# Patient Record
Sex: Female | Born: 2000
Health system: Southern US, Community
[De-identification: ages and names within clinical notes are randomized; demographics above are authoritative.]

## PROBLEM LIST (undated history)

## (undated) DIAGNOSIS — F603 Borderline personality disorder: Secondary | ICD-10-CM

## (undated) DIAGNOSIS — I10 Essential (primary) hypertension: Secondary | ICD-10-CM

## (undated) DIAGNOSIS — F913 Oppositional defiant disorder: Secondary | ICD-10-CM

## (undated) DIAGNOSIS — F909 Attention-deficit hyperactivity disorder, unspecified type: Secondary | ICD-10-CM

## (undated) DIAGNOSIS — K5909 Other constipation: Secondary | ICD-10-CM

## (undated) DIAGNOSIS — L309 Dermatitis, unspecified: Secondary | ICD-10-CM

## (undated) DIAGNOSIS — I499 Cardiac arrhythmia, unspecified: Secondary | ICD-10-CM

## (undated) DIAGNOSIS — E282 Polycystic ovarian syndrome: Secondary | ICD-10-CM

## (undated) DIAGNOSIS — F32A Depression, unspecified: Secondary | ICD-10-CM

## (undated) DIAGNOSIS — R011 Cardiac murmur, unspecified: Secondary | ICD-10-CM

## (undated) DIAGNOSIS — F419 Anxiety disorder, unspecified: Secondary | ICD-10-CM

## (undated) DIAGNOSIS — R519 Headache, unspecified: Secondary | ICD-10-CM

## (undated) HISTORY — PX: ADENOIDECTOMY: SUR15

## (undated) HISTORY — DX: Attention-deficit hyperactivity disorder, unspecified type: F90.9

## (undated) HISTORY — DX: Other constipation: K59.09

## (undated) HISTORY — DX: Anxiety disorder, unspecified: F41.9

## (undated) HISTORY — DX: Cardiac murmur, unspecified: R01.1

## (undated) HISTORY — DX: Cardiac arrhythmia, unspecified: I49.9

## (undated) HISTORY — DX: Depression, unspecified: F32.A

## (undated) HISTORY — DX: Dermatitis, unspecified: L30.9

## (undated) HISTORY — DX: Headache, unspecified: R51.9

## (undated) HISTORY — DX: Polycystic ovarian syndrome: E28.2

## (undated) HISTORY — PX: TONSILLECTOMY: SUR1361

## (undated) HISTORY — DX: Borderline personality disorder: F60.3

---
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
## 2021-02-16 ENCOUNTER — Ambulatory Visit (HOSPITAL_COMMUNITY): Payer: Medicaid Other | Admitting: Clinical

## 2021-03-27 ENCOUNTER — Other Ambulatory Visit: Payer: Self-pay

## 2021-03-27 ENCOUNTER — Telehealth (INDEPENDENT_AMBULATORY_CARE_PROVIDER_SITE_OTHER): Payer: Medicaid Other | Admitting: Psychiatry

## 2021-03-27 ENCOUNTER — Encounter (HOSPITAL_COMMUNITY): Payer: Self-pay | Admitting: Psychiatry

## 2021-03-27 DIAGNOSIS — F3342 Major depressive disorder, recurrent, in full remission: Secondary | ICD-10-CM | POA: Diagnosis not present

## 2021-03-27 MED ORDER — SERTRALINE HCL 100 MG PO TABS: ORAL_TABLET | ORAL | 3 refills | Status: DC

## 2021-03-27 MED ORDER — BUSPIRONE HCL 15 MG PO TABS: 15.0000 mg | ORAL_TABLET | Freq: Three times a day (TID) | ORAL | 3 refills | Status: DC

## 2021-03-27 MED ORDER — TRAZODONE HCL 150 MG PO TABS: 150.0000 mg | ORAL_TABLET | Freq: Every evening | ORAL | 3 refills | Status: DC | PRN

## 2021-03-27 MED ORDER — ARIPIPRAZOLE 10 MG PO TABS: 10.0000 mg | ORAL_TABLET | Freq: Every day | ORAL | 3 refills | Status: DC

## 2021-03-27 NOTE — Progress Notes (Signed)
[facility] MD/PA/NP OPVirtual Visit via Video Note  I connected with Danielle Hobbs on 03/27/21 at  4:00 PM EST by a video enabled telemedicine application and verified that I am speaking with the correct person using two identifiers.  Location: Patient: Home Provider: Clinic   I discussed the limitations of evaluation and management by telemedicine and the availability of in person appointments. The patient expressed understanding and agreed to proceed.  I provided 30 minutes of non-face-to-face time during this encounter.   03/27/2021 4:28 PM Danielle Hobbs  MRN:  [id number]  Chief Complaint: "I have been feeling okay and not okay  HPI: 20 year old female seen today for follow-up psychiatric evaluation.    She has a psychiatric history of ADHD, adjustment disorder, and depression.  She is currently managed on Adderall XR2 0 mg twice daily, Abilify 5 mg daily, BuSpar 10 mg twice daily, Zoloft 200 mg daily, and trazodone 150 mg nightly.  She notes her medications are somewhat effective in managing her psychiatric conditions.  Today she is pleasant, cooperative, engaged in conversation, and maintains eye contact.  She informed Clinical research associate that since her last visit she has been feeling okay some days and not okay on other days. She notes that recently she has cut some negative people out her life and is adjusting to this new change. Patient also informed writer that she has been more anxious and depressed. Provider conducted a GAD 7 and patient scored a 19, at her last visit she scored a 18. Provider also conducted a PHQ 9 and patient scored a 20. Today she endorses passive SI but denies wanting to harm herself noting that she has had passive SI for years. Today she denies SI/HI/VAH or mania. At times she notes that she feels paranoid. She notes at times her sleep is poor noting she sleeps 4-5 hour. She notes that on occassions she take Trazodone 300 mg. Provider informed patient to be cautious about adjusting  Trazodone while on Zoloft.  Patient reports having an adequate appetite but notes that she has lost 20 pounds since her last visit. She notes that she has been more active (walking for an hour). Since walking she notes that she has more energy but reports still feeling tired.   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  Today she is agreeable to increase Buspar 10 mg three times daily to 15 mg three times daily help manage anxiety and depression. She was also agreeable to increase Abilify 5 mg to 19 mg to help manage depression. Patient missed therapy appointment but was rescheduled today to follow up with outpatient therapy. No other concerns noted at this time.   Visit Diagnosis:    ICD-10-CM   1. MDD (major depressive disorder), recurrent, in full remission (HCC)  F33.42 ARIPiprazole (ABILIFY) 10 MG tablet    busPIRone (BUSPAR) 15 MG tablet    sertraline (ZOLOFT) 100 MG tablet    traZODone (DESYREL) 150 MG tablet      Past Psychiatric History: Adjustment disorder, ADHD, and Depression   Past Medical History:  Past Medical History:  Diagnosis Date   ADHD (attention deficit hyperactivity disorder)    Anxiety    Chronic constipation 01/15/2013   Depression    Phreesia 08/06/2020   Eczema 02/23/2014   Heart murmur    Phreesia 08/06/2020   Irregular heart rhythm 02/23/2014   Benign PVC cardiology evaluated  and no change to ADHD meds needed     Oppositional defiant disorder    PCOS (polycystic ovarian syndrome)     Past Surgical History:  Procedure Laterality Date   ADENOIDECTOMY     TONSILLECTOMY      Family Psychiatric History: Maternal Grandmother Bipolar disorder, anxiety, and depression   Family History:  Family History  Problem Relation Age of Onset   Diabetes Maternal Grandmother    Drug abuse Maternal Grandmother    Bipolar disorder Maternal  Grandmother    Depression Maternal Grandmother    Anxiety disorder Mother    Depression Mother    Hypertension Other     Social History:  Social History   Socioeconomic History   Marital status: Single    Spouse name: Not on file   Number of children: 0   Years of education: Not on file   Highest education level: 12th grade  Occupational History   Not on file  Tobacco Use   Smoking status: Passive Smoke Exposure - Never Smoker   Smokeless tobacco: Never   Tobacco comments:    Moms smoke in house  Vaping Use   Vaping Use: Never used  Substance and Sexual Activity   Alcohol use: Not Currently   Drug use: Yes    Types: Marijuana   Sexual activity: Yes    Birth control/protection: Pill  Other Topics Concern   Not on file  Social History Narrative   Not on file   Social Determinants of Health   Financial Resource Strain: Not on file  Food Insecurity: No Food Insecurity   Worried About Running Out of Food in the Last Year: Never true   Ran Out of Food in the Last Year: Never true  Transportation Needs: Not on file  Physical Activity: Not on file  Stress: Not on file  Social Connections: Not on file    Allergies: No Known Allergies  Metabolic Disorder Labs: Lab Results  Component Value Date   HGBA1C 5.8 (H) 02/18/2019   MPG 119.76 02/18/2019   MPG 117 07/04/2017   Lab Results  Component Value Date   PROLACTIN 14.3 01/05/2016   Lab Results  Component Value Date   CHOL 133 02/18/2019   TRIG 63 02/18/2019   HDL 38 (L) 02/18/2019   CHOLHDL 3.5 02/18/2019   VLDL 13 02/18/2019   LDLCALC 82 02/18/2019   LDLCALC 76 01/05/2016   Lab Results  Component Value Date   TSH 1.702 02/18/2019   TSH 1.53 01/05/2016    Therapeutic Level Labs: No results found for: LITHIUM No results found for: VALPROATE No components found for:  CBMZ  Current Medications: Current Outpatient Medications  Medication Sig Dispense Refill   amphetamine-dextroamphetamine (ADDERALL  XR) 20 MG 24 hr capsule Take 2 capsules in the morning 60 capsule 0   ARIPiprazole (ABILIFY) 10 MG tablet Take 1 tablet (10 mg total) by mouth daily. 30 tablet 3   busPIRone (BUSPAR) 15 MG tablet Take 1 tablet (15 mg total) by mouth 3 (three) times daily. 90 tablet 3   cetirizine (ZYRTEC) 10 MG tablet Take 1 tablet (10 mg total) by mouth daily. 30 tablet 3   norgestimate-ethinyl estradiol (SPRINTEC 28) 0.25-35 MG-MCG tablet Take 1 tablet by mouth daily. 30 tablet 4   sertraline (ZOLOFT) 100 MG tablet Take two tablets daily 60 tablet 3   traZODone (DESYREL) 150 MG tablet Take 1 tablet (150 mg total) by mouth at bedtime as needed for sleep. 30 tablet 3  No current facility-administered medications for this visit.     Musculoskeletal: Strength & Muscle Tone:  Unable to assess due to telehealth visit Gait & Station:  Unable to assess due to telehealth visit Patient leans: N/A  Psychiatric Specialty Exam: Review of Systems  There were no vitals taken for this visit.There is no height or weight on file to calculate BMI.  General Appearance: Well Groomed  Eye Contact:  Good  Speech:  Clear and Coherent and Normal Rate  Volume:  Normal  Mood:  Anxious and Depressed  Affect:  Appropriate and Congruent  Thought Process:  Coherent, Goal Directed, and Linear  Orientation:  Full (Time, Place, and Person)  Thought Content: WDL and Logical   Suicidal Thoughts:  No  Homicidal Thoughts:  No  Memory:  Immediate;   Good Recent;   Good Remote;   Good  Judgement:  Good  Insight:  Good  Psychomotor Activity:  Normal  Concentration:  Concentration: Good and Attention Span: Good  Recall:  Good  Fund of Knowledge: Good  Language: Good  Akathisia:  No  Handed:  Right  AIMS (if indicated): not done  Assets:  Communication Skills Desire for Improvement Financial Resources/Insurance Housing Physical Health Social Support Transportation  ADL's:  Intact  Cognition: WNL  Sleep:  Fair    Screenings: AIMS    Flowsheet Row [facility]  AIMS Total Score 0      GAD-7    Flowsheet Row Video Visit from 03/27/2021 in [facility] Video Visit from 12/25/2020 in [facility] Integrated Behavioral Health from 06/13/2017 in [place] and [facility] Center for Child and Adolescent Health  Total GAD-7 Score 19 18 13       PHQ2-9    Flowsheet Row Video Visit from 03/27/2021 in [facility] Video Visit from 12/25/2020 in [facility] Nutrition from 09/14/2020 in [facility] from 07/04/2017 in [place] and [facility] Center for Child and Adolescent Health Integrated Behavioral Health from 06/13/2017 in [place] and [facility] Center for Child and Adolescent Health  PHQ-2 Total Score 4 4 6 3 4   PHQ-9 Total Score 20 19 15 11 13       Flowsheet Row Video Visit from 03/27/2021 in [facility] Video Visit from 12/25/2020 in [facility] ED from 08/22/2020 in [facility] EMERGENCY DEPARTMENT  C-SSRS RISK CATEGORY Error: Q7 should not be populated when Q6 is No Error: Q7 should not be populated when Q6 is No No Risk        Assessment and Plan: Patient notes that she is anxious and depressed most day and has disturbed sleep. Today she is agreeable to increase Buspar 10 mg three times daily to 15 mg three times daily help manage anxiety and depression. She was also agreeable to increase Abilify 5 mg to 19 mg to help manage depression. Patient missed therapy appointment but was rescheduled today to follow up with outpatient therapy  1. MDD (major depressive disorder), recurrent, in full remission (HCC)  Increased- ARIPiprazole (ABILIFY) 10 MG tablet; Take 1 tablet (10 mg total) by mouth  daily.  Dispense: 30 tablet; Refill: 3 Increased- busPIRone (BUSPAR) 15 MG tablet; Take 1 tablet (15 mg total) by mouth 3 (three) times daily.  Dispense: 90 tablet; Refill: 3 Continue- sertraline (ZOLOFT) 100 MG tablet; Take two tablets  daily  Dispense: 60 tablet; Refill: 3 Continue- traZODone (DESYREL) 150 MG tablet; Take 1 tablet (150 mg total) by mouth at bedtime as needed for sleep.  Dispense: 30 tablet; Refill: 3    Follow-up in 3 months Follow-up with therapy  Shanna Cisco, NP 03/27/2021, 4:28 PM

## 2021-04-19 ENCOUNTER — Other Ambulatory Visit (HOSPITAL_COMMUNITY): Payer: Self-pay | Admitting: Psychiatry

## 2021-04-19 ENCOUNTER — Telehealth (HOSPITAL_COMMUNITY): Payer: Self-pay | Admitting: *Deleted

## 2021-04-19 DIAGNOSIS — F900 Attention-deficit hyperactivity disorder, predominantly inattentive type: Secondary | ICD-10-CM

## 2021-04-19 MED ORDER — AMPHETAMINE-DEXTROAMPHET ER 20 MG PO CP24: ORAL_CAPSULE | ORAL | 0 refills | Status: DC

## 2021-04-19 NOTE — Telephone Encounter (Signed)
Call from [facility] requesting a new rx for her Adderall. She was not aware it had to be called in monthly and per chart it hasnt been called in since Aug. Dr Doyne Keel last note does not include it in her summary paragraph but it is listed in meds she is taking. She has a next appt with her provider on 06/19/21. Will forward request to Dr Doyne Keel to see if she would like to continue with it and send it to her preferred pharmacy.

## 2021-04-19 NOTE — Telephone Encounter (Signed)
Medication refilled and sent to preferred pharmacy

## 2021-05-24 ENCOUNTER — Other Ambulatory Visit (HOSPITAL_COMMUNITY): Payer: Self-pay | Admitting: Psychiatry

## 2021-05-24 ENCOUNTER — Telehealth (HOSPITAL_COMMUNITY): Payer: Self-pay | Admitting: *Deleted

## 2021-05-24 DIAGNOSIS — F900 Attention-deficit hyperactivity disorder, predominantly inattentive type: Secondary | ICD-10-CM

## 2021-05-24 MED ORDER — AMPHETAMINE-DEXTROAMPHET ER 20 MG PO CP24: ORAL_CAPSULE | ORAL | 0 refills | Status: DC

## 2021-05-24 NOTE — Telephone Encounter (Signed)
Provider refilled adderall as patient has refills on other medications. Adderall sent to preferred pharmacy.

## 2021-05-24 NOTE — Telephone Encounter (Signed)
Request from patient for a new rx for her adderall. Reviewed record and she should be out. She has a future appt with Dr Doyne Keel on 06/19/21. She was seen as recently as Nov. Will forward request to Dr Doyne Keel for it to be called into her preferred pharmacy.

## 2021-06-06 ENCOUNTER — Ambulatory Visit (INDEPENDENT_AMBULATORY_CARE_PROVIDER_SITE_OTHER): Payer: Medicaid Other | Admitting: Licensed Clinical Social Worker

## 2021-06-06 ENCOUNTER — Other Ambulatory Visit: Payer: Self-pay

## 2021-06-06 DIAGNOSIS — F331 Major depressive disorder, recurrent, moderate: Secondary | ICD-10-CM
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
## 2021-06-19 ENCOUNTER — Telehealth (INDEPENDENT_AMBULATORY_CARE_PROVIDER_SITE_OTHER): Payer: Medicaid Other | Admitting: Psychiatry

## 2021-06-19 ENCOUNTER — Other Ambulatory Visit: Payer: Self-pay

## 2021-06-19 ENCOUNTER — Encounter (HOSPITAL_COMMUNITY): Payer: Self-pay | Admitting: Psychiatry

## 2021-06-19 DIAGNOSIS — F900 Attention-deficit hyperactivity disorder, predominantly inattentive type: Secondary | ICD-10-CM

## 2021-06-19 DIAGNOSIS — F3342 Major depressive disorder, recurrent, in full remission: Secondary | ICD-10-CM | POA: Diagnosis not present

## 2021-06-19 MED ORDER — AMPHETAMINE-DEXTROAMPHET ER 20 MG PO CP24: ORAL_CAPSULE | ORAL | 0 refills | Status: DC

## 2021-06-19 MED ORDER — BUSPIRONE HCL 15 MG PO TABS: 15.0000 mg | ORAL_TABLET | Freq: Three times a day (TID) | ORAL | 3 refills | Status: DC

## 2021-06-19 MED ORDER — TRAZODONE HCL 150 MG PO TABS: 150.0000 mg | ORAL_TABLET | Freq: Every evening | ORAL | 3 refills | Status: DC | PRN

## 2021-06-19 MED ORDER — ARIPIPRAZOLE 10 MG PO TABS: 10.0000 mg | ORAL_TABLET | Freq: Every day | ORAL | 3 refills | Status: AC

## 2021-06-19 MED ORDER — SERTRALINE HCL 100 MG PO TABS: ORAL_TABLET | ORAL | 3 refills | Status: DC

## 2021-06-19 NOTE — Progress Notes (Signed)
[facility] O[facility] Video Note  Virtual Visit via Telephone Note  I connected with Danielle Hobbs on 06/19/21 at  3:30 PM EST by telephone and verified that I am speaking with the correct person using two identifiers.  Location: Patient: home Provider: Clinic   I discussed the limitations, risks, security and privacy concerns of performing an evaluation and management service by telephone and the availability of in person appointments. I also discussed with the patient that there may be a patient responsible charge related to this service. The patient expressed understanding and agreed to proceed.   I provided 30 minutes of non-face-to-face time during this encounter.    06/19/2021 12:19 PM Danielle BoatmanJayna Hobbs  MRN:  [id number]  Chief Complaint: "  I feel fine"  Per mother "I am very proud of her accomplishments"  HPI: 21 year old female seen today for follow-up psychiatric evaluation.    She has a psychiatric history of ADHD, adjustment disorder, and depression.  She is currently managed on Adderall XR 20 mg twice daily, Abilify 10 mg daily, BuSpar 15 mg twice daily, Zoloft 200 mg daily, and trazodone 150 mg nightly.  She notes her medications are  effective in managing her psychiatric conditions.  Today she is unable to login virtually so assessment was done over the phone.  During exam she was pleasant, cooperative, and engaged in conversation.  She informed Clinical research associatewriter that since her last visit she has been feeling fine.  She reports that the increase in BuSpar and Abilify has positively impacted her anxiety and depression.  Patient was seen with her mother who notes that she is proud of her accomplishment and notes that she has been happier recently.  Patient informed Clinical research associatewriter that she has had positive events to occur in her life.  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  She continues to study [employer] but notes that she has not  returned to school yet this semester.  She plans to eventually transfer to [employer]  Since her last visit she reports that her anxiety, depression, and sleep has improved.  Provider conducted a GAD-7 and patient scored a 14, at her last visit she scored a 19.  Provider also conducted PHQ-9 and patient scored an 11, at her last visit she scored a 20.  She endorses sleeping approximately 5 to 6 hours nightly.  She reports that her appetite is adequate.  Today she endorses passive SI but denies wanting to harm herself.  She denies SI/HI/VH, mania, or paranoia.   No medication changes made today.  Patient agreeable to continue medication as prescribed.  She will follow-up with outpatient counseling for therapy.  No other complaints.  Visit Diagnosis:    ICD-10-CM   1. Attention deficit hyperactivity disorder (ADHD), predominantly inattentive type  F90.0 amphetamine-dextroamphetamine (ADDERALL XR) 20 MG 24 hr capsule    2. MDD (major depressive disorder), recurrent, in full remission (HCC)  F33.42 ARIPiprazole (ABILIFY) 10 MG tablet    busPIRone (BUSPAR) 15 MG tablet    sertraline (ZOLOFT) 100 MG tablet    traZODone (DESYREL) 150 MG tablet      Past Psychiatric History: Adjustment disorder, ADHD, and Depression   Past Medical History:  Past Medical History:  Diagnosis Date   ADHD (attention deficit hyperactivity disorder)    Anxiety    Chronic constipation 01/15/2013   Depression    Phreesia 08/06/2020   Eczema 02/23/2014   Heart  murmur    Phreesia 08/06/2020   Irregular heart rhythm 02/23/2014   Benign PVC cardiology evaluated and no change to ADHD meds needed     Oppositional defiant disorder    PCOS (polycystic ovarian syndrome)     Past Surgical History:  Procedure Laterality Date   ADENOIDECTOMY     TONSILLECTOMY      Family Psychiatric History: Maternal Grandmother Bipolar disorder, anxiety, and depression   Family History:  Family History  Problem Relation Age  of Onset   Diabetes Maternal Grandmother    Drug abuse Maternal Grandmother    Bipolar disorder Maternal Grandmother    Depression Maternal Grandmother    Anxiety disorder Mother    Depression Mother    Hypertension Other     Social History:  Social History   Socioeconomic History   Marital status: Single    Spouse name: Not on file   Number of children: 0   Years of education: Not on file   Highest education level: 12th grade  Occupational History   Not on file  Tobacco Use   Smoking status: Passive Smoke Exposure - Never Smoker   Smokeless tobacco: Never   Tobacco comments:    Moms smoke in house  Vaping Use   Vaping Use: Never used  Substance and Sexual Activity   Alcohol use: Not Currently   Drug use: Yes    Types: Marijuana   Sexual activity: Yes    Birth control/protection: Pill  Other Topics Concern   Not on file  Social History Narrative   Not on file   Social Determinants of Health   Financial Resource Strain: Not on file  Food Insecurity: No Food Insecurity   Worried About Running Out of Food in the Last Year: Never true   Ran Out of Food in the Last Year: Never true  Transportation Needs: Not on file  Physical Activity: Not on file  Stress: Not on file  Social Connections: Not on file    Allergies: No Known Allergies  Metabolic Disorder Labs: Lab Results  Component Value Date   HGBA1C 5.8 (H) 02/18/2019   MPG 119.76 02/18/2019   MPG 117 07/04/2017   Lab Results  Component Value Date   PROLACTIN 14.3 01/05/2016   Lab Results  Component Value Date   CHOL 133 02/18/2019   TRIG 63 02/18/2019   HDL 38 (L) 02/18/2019   CHOLHDL 3.5 02/18/2019   VLDL 13 02/18/2019   LDLCALC 82 02/18/2019   LDLCALC 76 01/05/2016   Lab Results  Component Value Date   TSH 1.702 02/18/2019   TSH 1.53 01/05/2016    Therapeutic Level Labs: No results found for: LITHIUM No results found for: VALPROATE No components found for:  CBMZ  Current  Medications: Current Outpatient Medications  Medication Sig Dispense Refill   amphetamine-dextroamphetamine (ADDERALL XR) 20 MG 24 hr capsule Take 2 capsules in the morning 60 capsule 0   ARIPiprazole (ABILIFY) 10 MG tablet Take 1 tablet (10 mg total) by mouth daily. 30 tablet 3   busPIRone (BUSPAR) 15 MG tablet Take 1 tablet (15 mg total) by mouth 3 (three) times daily. 90 tablet 3   cetirizine (ZYRTEC) 10 MG tablet Take 1 tablet (10 mg total) by mouth daily. 30 tablet 3   norgestimate-ethinyl estradiol (SPRINTEC 28) 0.25-35 MG-MCG tablet Take 1 tablet by mouth daily. 30 tablet 4   sertraline (ZOLOFT) 100 MG tablet Take two tablets daily 60 tablet 3   traZODone (DESYREL) 150 MG tablet  Take 1 tablet (150 mg total) by mouth at bedtime as needed for sleep. 30 tablet 3   No current facility-administered medications for this visit.     Musculoskeletal: Strength & Muscle Tone:  Unable to assess due to telephone visit Gait & Station:  Unable to assess due to telephone visit Patient leans: N/A  Psychiatric Specialty Exam: Review of Systems  There were no vitals taken for this visit.There is no height or weight on file to calculate BMI.  General Appearance:  Unable to assess due to telephone visit  Eye Contact:   Unable to assess due to telephone visit  Speech:  Clear and Coherent and Normal Rate  Volume:  Normal  Mood:  Euthymic  Affect:  Appropriate and Congruent  Thought Process:  Coherent, Goal Directed, and Linear  Orientation:  Full (Time, Place, and Person)  Thought Content: WDL and Logical   Suicidal Thoughts:  Yes.  with intent/plan  Homicidal Thoughts:  No  Memory:  Immediate;   Good Recent;   Good Remote;   Good  Judgement:  Good  Insight:  Good  Psychomotor Activity:  Normal  Concentration:  Concentration: Good and Attention Span: Good  Recall:  Good  Fund of Knowledge: Good  Language: Good  Akathisia:  No  Handed:  Right  AIMS (if indicated): not done  Assets:   Communication Skills Desire for Improvement Financial Resources/Insurance Housing Physical Health Social Support Transportation  ADL's:  Intact  Cognition: WNL  Sleep:  Fair   Screenings: AIMS    Flowsheet Row [facility]  AIMS Total Score 0      GAD-7    Flowsheet Row Video Visit from 06/19/2021 in [facility] Video Visit from 03/27/2021 in [facility] Video Visit from 12/25/2020 in [facility] from 06/13/2017 in [facility]  Total GAD-7 Score 14 19 18 13       PHQ2-9    Flowsheet Row Video Visit from 06/19/2021 in [facility] Counselor from 06/06/2021 in [facility] Video Visit from 03/27/2021 in [facility] Video Visit from 12/25/2020 in [facility] Nutrition from 09/14/2020 in [facility]  PHQ-2 Total Score 4 5 4 4 6   PHQ-9 Total Score 11 17 20 19 15       Flowsheet Row Video Visit from 06/19/2021 in [facility] Counselor from 06/06/2021 in [facility] Video Visit from 03/27/2021 in [facility]  C-SSRS RISK CATEGORY Error: Q7 should not be populated when Q6 is No High Risk Error: Q7 should not be populated when Q6 is No        Assessment and Plan: Patient notes that her sleep, anxiety, and depression has improved since her last visit.  No medication changes made today.  Patient agreeable to continue medications as prescribed.    1. MDD (major depressive disorder), recurrent, in full remission (HCC)  Continue- ARIPiprazole (ABILIFY) 10 MG tablet; Take 1 tablet (10 mg total) by mouth daily.  Dispense: 30 tablet;  Refill: 3 Continue- busPIRone (BUSPAR) 15 MG tablet; Take 1 tablet (15 mg total) by mouth 3 (three) times daily.  Dispense: 90 tablet; Refill: 3 Continue- sertraline (ZOLOFT) 100 MG tablet; Take two tablets daily  Dispense: 60 tablet; Refill: 3  Continue- traZODone (DESYREL) 150 MG tablet; Take 1 tablet (150 mg total) by mouth at bedtime as needed for sleep.  Dispense: 30 tablet; Refill: 3    Follow-up in 3 months Follow-up with therapy  Shanna Cisco, NP 06/19/2021, 12:19 PM

## 2021-06-20 ENCOUNTER — Telehealth (HOSPITAL_COMMUNITY): Payer: Self-pay

## 2021-06-20 NOTE — Telephone Encounter (Signed)
RECEIVED FAX FROM [facility] ON [address] IN [place] REQUIRING A PA ON PATIENT'S ADDERALL XR 20MG . PHARMACY TOLD WRITER THAT A QUANTITY EXCEPTION NEEDED TO BE DONE ON THE BRAND NAME. WRITER S/W TAMIKA AT [facility] WHO STATED THAT IT JUST REQUIRES AN OVERRIDE BY THE PHARMACY. WRITER NOTIFIED PHARMACY AND PT'S ADDERALL XR WENT THROUGH W/ THE OVERRIDE. PT'S PARENT/GUARDIAN IS SET UP WITH TEXT NOTIFICATION SO WILL BE NOTIFIED WHEN MEDICATION IS READY FOR PICKUP

## 2021-07-16 ENCOUNTER — Emergency Department (HOSPITAL_COMMUNITY)
Admission: EM | Admit: 2021-07-16 | Discharge: 2021-07-17 | Disposition: A | Discharge status: Home / Self Care | Payer: Medicaid Other | Source: Home / Self Care | Attending: Emergency Medicine | Admitting: Emergency Medicine

## 2021-07-16 ENCOUNTER — Emergency Department (HOSPITAL_COMMUNITY)
Admission: EM | Admit: 2021-07-16 | Discharge: 2021-07-16 | Disposition: A | Discharge status: Home / Self Care | Payer: Medicaid Other | Attending: Emergency Medicine | Admitting: Emergency Medicine

## 2021-07-16 ENCOUNTER — Other Ambulatory Visit: Payer: Self-pay

## 2021-07-16 ENCOUNTER — Encounter (HOSPITAL_COMMUNITY): Payer: Self-pay

## 2021-07-16 ENCOUNTER — Encounter (HOSPITAL_COMMUNITY): Payer: Self-pay | Admitting: Emergency Medicine

## 2021-07-16 DIAGNOSIS — D649 Anemia, unspecified: Secondary | ICD-10-CM | POA: Diagnosis not present

## 2021-07-16 DIAGNOSIS — J111 Influenza due to unidentified influenza virus with other respiratory manifestations: Secondary | ICD-10-CM

## 2021-07-16 DIAGNOSIS — G44201 Tension-type headache, unspecified, intractable: Secondary | ICD-10-CM | POA: Diagnosis not present

## 2021-07-16 DIAGNOSIS — R0789 Other chest pain: Secondary | ICD-10-CM | POA: Insufficient documentation

## 2021-07-16 DIAGNOSIS — J029 Acute pharyngitis, unspecified: Secondary | ICD-10-CM | POA: Diagnosis not present

## 2021-07-16 DIAGNOSIS — R519 Headache, unspecified: Secondary | ICD-10-CM | POA: Insufficient documentation

## 2021-07-16 DIAGNOSIS — R112 Nausea with vomiting, unspecified: Secondary | ICD-10-CM | POA: Diagnosis present

## 2021-07-16 DIAGNOSIS — Z20822 Contact with and (suspected) exposure to covid-19: Secondary | ICD-10-CM | POA: Diagnosis not present

## 2021-07-16 DIAGNOSIS — R1084 Generalized abdominal pain: Secondary | ICD-10-CM | POA: Insufficient documentation

## 2021-07-16 DIAGNOSIS — R059 Cough, unspecified: Secondary | ICD-10-CM | POA: Insufficient documentation

## 2021-07-16 DIAGNOSIS — R0981 Nasal congestion: Secondary | ICD-10-CM | POA: Insufficient documentation

## 2021-07-16 DIAGNOSIS — R109 Unspecified abdominal pain: Secondary | ICD-10-CM | POA: Insufficient documentation

## 2021-07-16 LAB — CBC WITH DIFFERENTIAL/PLATELET
Abs Immature Granulocytes: 0.03 10*3/uL (ref 0.00–0.07)
Basophils Absolute: 0 10*3/uL (ref 0.0–0.1)
Basophils Relative: 0 %
Eosinophils Absolute: 0.1 10*3/uL (ref 0.0–0.5)
Eosinophils Relative: 2 %
HCT: 33.3 % — ABNORMAL LOW (ref 36.0–46.0)
Hemoglobin: 9.7 g/dL — ABNORMAL LOW (ref 12.0–15.0)
Immature Granulocytes: 0 %
Lymphocytes Relative: 27 %
Lymphs Abs: 2.2 10*3/uL (ref 0.7–4.0)
MCH: 21 pg — ABNORMAL LOW (ref 26.0–34.0)
MCHC: 29.1 g/dL — ABNORMAL LOW (ref 30.0–36.0)
MCV: 71.9 fL — ABNORMAL LOW (ref 80.0–100.0)
Monocytes Absolute: 0.6 10*3/uL (ref 0.1–1.0)
Monocytes Relative: 7 %
Neutro Abs: 5.3 10*3/uL (ref 1.7–7.7)
Neutrophils Relative %: 64 %
Platelets: 378 10*3/uL (ref 150–400)
RBC: 4.63 MIL/uL (ref 3.87–5.11)
RDW: 17.6 % — ABNORMAL HIGH (ref 11.5–15.5)
WBC: 8.3 10*3/uL (ref 4.0–10.5)
nRBC: 0 % (ref 0.0–0.2)

## 2021-07-16 LAB — RESP PANEL BY RT-PCR (FLU A&B, COVID) ARPGX2
Influenza A by PCR: NEGATIVE
Influenza B by PCR: NEGATIVE
SARS Coronavirus 2 by RT PCR: NEGATIVE

## 2021-07-16 LAB — URINALYSIS, ROUTINE W REFLEX MICROSCOPIC
Bilirubin Urine: NEGATIVE
Glucose, UA: NEGATIVE mg/dL
Ketones, ur: 5 mg/dL — AB
Leukocytes,Ua: NEGATIVE
Nitrite: NEGATIVE
Protein, ur: 100 mg/dL — AB
RBC / HPF: >50 RBC/hpf — ABNORMAL HIGH (ref 0–5)
Specific Gravity, Urine: 1.03 (ref 1.005–1.030)
pH: 5 (ref 5.0–8.0)

## 2021-07-16 LAB — COMPREHENSIVE METABOLIC PANEL
ALT: 24 U/L (ref 0–44)
AST: 29 U/L (ref 15–41)
Albumin: 3.4 g/dL — ABNORMAL LOW (ref 3.5–5.0)
Alkaline Phosphatase: 45 U/L (ref 38–126)
Anion gap: 8 (ref 5–15)
BUN: 10 mg/dL (ref 6–20)
CO2: 24 mmol/L (ref 22–32)
Calcium: 9 mg/dL (ref 8.9–10.3)
Chloride: 105 mmol/L (ref 98–111)
Creatinine, Ser: 0.8 mg/dL (ref 0.44–1.00)
GFR, Estimated: >60 mL/min (ref >60)
Glucose, Bld: 95 mg/dL (ref 70–99)
Potassium: 4.3 mmol/L (ref 3.5–5.1)
Sodium: 137 mmol/L (ref 135–145)
Total Bilirubin: 1.1 mg/dL (ref 0.3–1.2)
Total Protein: 6.8 g/dL (ref 6.5–8.1)

## 2021-07-16 LAB — I-STAT BETA HCG BLOOD, ED (MC, WL, AP ONLY): I-stat hCG, quantitative: <5 m[IU]/mL (ref <5)

## 2021-07-16 LAB — LIPASE, BLOOD: Lipase: 25 U/L (ref 11–51)

## 2021-07-16 MED ORDER — KETOROLAC TROMETHAMINE 30 MG/ML IJ SOLN
30.0000 mg | Freq: Once | INTRAMUSCULAR | Status: AC
  Administered 2021-07-16: 30 mg via INTRAMUSCULAR
  Filled 2021-07-16: qty 1

## 2021-07-16 MED ORDER — ONDANSETRON HCL 4 MG PO TABS: 4.0000 mg | ORAL_TABLET | Freq: Three times a day (TID) | ORAL | 0 refills | Status: DC | PRN

## 2021-07-16 MED ORDER — ONDANSETRON 4 MG PO TBDP
8.0000 mg | ORAL_TABLET | Freq: Once | ORAL | Status: AC
  Administered 2021-07-16: 8 mg via ORAL
  Filled 2021-07-16: qty 2

## 2021-07-16 NOTE — Discharge Instructions (Addendum)
Drink plenty of fluids. I have prescribed you zofran for nausea. Please return if you develop worsening or concerning symptoms. ?

## 2021-07-16 NOTE — ED Provider Triage Note (Signed)
Emergency Medicine Provider Triage Evaluation Note ? ?Danielle Hobbs , a 21 y.o. female  was evaluated in triage.  Pt complains of headache for the last few days.  Also endorsing some emesis.  Reports photophobia over the last few days and floaters.  No history of migraines.  Has used Tylenol however it did not help her.  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  Headache has been increasing in severity and she localizes it to her forehead in a tension distribution. ? ?Review of Systems  ?See above ? ?Physical Exam  ?BP (!) 158/117 (BP Location: Right Arm)   Pulse 83   Temp 99.1 ?F (37.3 ?C) (Oral)   Resp 14   SpO2 100%  ?Gen:   Awake, no distress   ?Resp:  Normal effort  ?MSK:   Moves extremities without difficulty  ?Other:  Neurologically intact.  PERRLA.  Photophobia noted.  5 out of 5 strength in bilateral upper and lower extremities ? ?Medical Decision Making  ?Medically screening exam initiated at 11:06 AM.  Appropriate orders placed.  Lenzi Marmo was informed that the remainder of the evaluation will be completed by another provider, this initial triage assessment does not replace that evaluation, and the importance of remaining in the ED until their evaluation is complete. ? ? ? ?Likely migraine ?  ?Saddie Benders, PA-C ?07/16/21 1108 ? ?

## 2021-07-16 NOTE — ED Provider Notes (Cosign Needed)
[facility] EMERGENCY DEPARTMENT Provider Note   CSN: [id number] Arrival date & time: 07/16/21  1042     History  Chief Complaint  Patient presents with   Emesis    Danielle Hobbs is a 21 y.o. female. Patient presents the emergency department with a chief complaint of nausea and vomiting. States that about 4 days ago she started having upper respiratory symptoms including congestion, sore throat, cough, and hoarse voice.  The symptoms were persistent and she was taking NyQuil for them.  She started to feel little bit better, however yesterday she started noticing some nausea and vomiting.  She says that she has vomited 3 times and had persistent nausea.  She has associated abdominal pain in a generalized distribution.  She also states that since 2 AM she has had a headache that goes around both temporal regions and feels like squeezing.  She has taken Tylenol without any relief.  Emesis Associated symptoms: abdominal pain, cough, headaches and sore throat       Home Medications Prior to Admission medications   Medication Sig Start Date End Date Taking? Authorizing Provider  ondansetron (ZOFRAN) 4 MG tablet Take 1 tablet (4 mg total) by mouth every 8 (eight) hours as needed for nausea or vomiting. 07/16/21 08/15/21 Yes Dodie Parisi, Finis Bud, PA-C  amphetamine-dextroamphetamine (ADDERALL XR) 20 MG 24 hr capsule Take 2 capsules in the morning 06/19/21   Toy Cookey E, NP  ARIPiprazole (ABILIFY) 10 MG tablet Take 1 tablet (10 mg total) by mouth daily. 06/19/21   Shanna Cisco, NP  busPIRone (BUSPAR) 15 MG tablet Take 1 tablet (15 mg total) by mouth 3 (three) times daily. 06/19/21   Shanna Cisco, NP  cetirizine (ZYRTEC) 10 MG tablet Take 1 tablet (10 mg total) by mouth daily. 07/04/17   Marca Ancona, MD  norgestimate-ethinyl estradiol (SPRINTEC 28) 0.25-35 MG-MCG tablet Take 1 tablet by mouth daily. 08/08/20   Roxy Horseman, MD  sertraline (ZOLOFT) 100  MG tablet Take two tablets daily 06/19/21   Shanna Cisco, NP  traZODone (DESYREL) 150 MG tablet Take 1 tablet (150 mg total) by mouth at bedtime as needed for sleep. 06/19/21   Shanna Cisco, NP      Allergies    Patient has no known allergies.    Review of Systems   Review of Systems  HENT:  Positive for congestion and sore throat.   Respiratory:  Positive for cough.   Gastrointestinal:  Positive for abdominal pain, nausea and vomiting.  Neurological:  Positive for headaches.  All other systems reviewed and are negative.  Physical Exam Updated Vital Signs BP (!) 141/106 (BP Location: Right Arm)    Pulse 85    Temp 99.1 F (37.3 C) (Oral)    Resp 16    SpO2 100%  Physical Exam Vitals and nursing note reviewed.  Constitutional:      General: She is not in acute distress.    Appearance: Normal appearance. She is not ill-appearing, toxic-appearing or diaphoretic.  HENT:     Head: Normocephalic and atraumatic.     Right Ear: Tympanic membrane, ear canal and external ear normal. There is no impacted cerumen.     Left Ear: Tympanic membrane, ear canal and external ear normal. There is no impacted cerumen.     Nose: Congestion present. No nasal deformity.     Mouth/Throat:     Lips: Pink. No lesions.     Mouth: Mucous membranes are moist.  No injury, lacerations, oral lesions or angioedema.     Pharynx: Oropharynx is clear. Uvula midline. No pharyngeal swelling, oropharyngeal exudate, posterior oropharyngeal erythema or uvula swelling.  Eyes:     General: Gaze aligned appropriately. No scleral icterus.       Right eye: No discharge.        Left eye: No discharge.     Conjunctiva/sclera: Conjunctivae normal.     Right eye: Right conjunctiva is not injected. No exudate or hemorrhage.    Left eye: Left conjunctiva is not injected. No exudate or hemorrhage. Cardiovascular:     Rate and Rhythm: Normal rate and regular rhythm.     Pulses: Normal pulses.          Radial pulses  are 2+ on the right side and 2+ on the left side.       Dorsalis pedis pulses are 2+ on the right side and 2+ on the left side.     Heart sounds: Normal heart sounds, S1 normal and S2 normal. Heart sounds not distant. No murmur heard.   No friction rub. No gallop. No S3 or S4 sounds.  Pulmonary:     Effort: Pulmonary effort is normal. No accessory muscle usage or respiratory distress.     Breath sounds: Normal breath sounds. No stridor. No wheezing, rhonchi or rales.  Chest:     Chest wall: No tenderness.  Abdominal:     General: Abdomen is flat. Bowel sounds are normal. There is no distension.     Palpations: Abdomen is soft. There is no mass or pulsatile mass.     Tenderness: There is abdominal tenderness. There is no right CVA tenderness, left CVA tenderness, guarding or rebound.     Hernia: No hernia is present.     Comments: Generalized abdominal tenderness with no guarding or rigidity.  Musculoskeletal:     Right lower leg: No edema.     Left lower leg: No edema.  Skin:    General: Skin is warm and dry.     Coloration: Skin is not jaundiced or pale.     Findings: No bruising, erythema, lesion or rash.  Neurological:     General: No focal deficit present.     Mental Status: She is alert and oriented to person, place, and time.     GCS: GCS eye subscore is 4. GCS verbal subscore is 5. GCS motor subscore is 6.  Psychiatric:        Mood and Affect: Mood normal.        Behavior: Behavior normal. Behavior is cooperative.    ED Results / Procedures / Treatments   Labs (all labs ordered are listed, but only abnormal results are displayed) Labs Reviewed  CBC WITH DIFFERENTIAL/PLATELET - Abnormal; Notable for the following components:      Result Value   Hemoglobin 9.7 (*)    HCT 33.3 (*)    MCV 71.9 (*)    MCH 21.0 (*)    MCHC 29.1 (*)    RDW 17.6 (*)    All other components within normal limits  COMPREHENSIVE METABOLIC PANEL - Abnormal; Notable for the following components:    Albumin 3.4 (*)    All other components within normal limits  URINALYSIS, ROUTINE W REFLEX MICROSCOPIC - Abnormal; Notable for the following components:   APPearance HAZY (*)    Hgb urine dipstick LARGE (*)    Ketones, ur 5 (*)    Protein, ur 100 (*)    RBC /  HPF >50 (*)    Bacteria, UA RARE (*)    All other components within normal limits  RESP PANEL BY RT-PCR (FLU A&B, COVID) ARPGX2  LIPASE, BLOOD  I-STAT BETA HCG BLOOD, ED ([facility], [facility], [facility] ONLY)    EKG None  Radiology No results found.  Procedures Procedures   Medications Ordered in ED Medications  ondansetron (ZOFRAN-ODT) disintegrating tablet 8 mg (8 mg Oral Given 07/16/21 1314)  ketorolac (TORADOL) 30 MG/ML injection 30 mg (30 mg Intramuscular Given 07/16/21 1315)    ED Course/ Medical Decision Making/ A&P Clinical Course as of 07/16/21 1454  Mon Jul 16, 2021  1340 CBC: Hgb 9.7, minimally decreased from baseline. This chronic. No acute concerns Pregnancy negative. Viral resp panel negative. [GL]    Clinical Course User Index [GL] Victorino Dike Finis Bud, PA-C                           Medical Decision Making Amount and/or Complexity of Data Reviewed Labs: ordered.  Risk Prescription drug management.    MDM  This is a 21 y.o. female who presents to the ED with abdominal pain, nausea, and vomiting in the setting of a recent URI.  Patient has stable vitals.  She is afebrile.  Her abdominal exam is with generalized tenderness in all quadrants.  No obvious rigidity or guarding present. She seems to have been getting better from a upper respiratory infection standpoint and her more concerning symptoms are the abdominal pain.  I do not think that she needs CT imaging at this time given a fairly benign abdominal exam, however will add on abdominal labs and treat her with Zofran. She also has an associated headache.  The headache is described similar to a tension headache.  She does not have any migraine symptoms such as  photophobia, dizziness, or blurry vision.  Will give Toradol to treat the headache.  I personally ordered, reviewed, and interpreted all laboratory work and imaging and agree with radiologist interpretation. Results interpreted below: CBC with stable anemia. No leukocytosis CMP unremarkable.  Lipase negative Pregnancy negative UA with small ketones. No sx of infection.  Reassessment, patient's symptoms have almost completely resolved.  This is likely a viral illness.  She is stating that she feels much better.  I think it is reasonable for her to be discharged home at this time.  We will provide her with Zofran prescription.  Return precautions provided.   Charting Requirements Additional history is obtained from:  Independent historian External Records from outside source obtained and reviewed including: n/a Social Determinants of Health:  none Pertinant PMH that complicates patient's illness: obesity, PCOS  Patient Care Problems that were addressed during this visit: - N/V: Acute illness with systemic symptoms - Headache, Tension: Acute illness with systemic symptoms Medications given in ED: IM toradol, Zofran Reevaluation of the patient after these medicines showed that the patient improved Disposition: supportive tx at home, return precaution  Portions of this note were generated with Dragon dictation software. Dictation errors may occur despite best attempts at proofreading.    Final Clinical Impression(s) / ED Diagnoses Final diagnoses:  Nausea and vomiting, unspecified vomiting type  Acute intractable tension-type headache    Rx / DC Orders ED Discharge Orders          Ordered    ondansetron (ZOFRAN) 4 MG tablet  Every 8 hours PRN        07/16/21 1437  Claudie Leach, [place] 07/16/21 1454

## 2021-07-16 NOTE — ED Triage Notes (Signed)
Pt reports with chest pain, abdominal pain, and vomiting. Pt states that she was seen earlier today but is still feeling bad.  ?

## 2021-07-16 NOTE — ED Provider Triage Note (Signed)
Emergency Medicine Provider Triage Evaluation Note ? ?Grecia Lynk , a 21 y.o. female  was evaluated in triage.  Pt complains of headache, migraine. Episodes of emesis and states she is not feeling well after being discharged around 7 hours ago. Workup was negative earlier. She is a Manufacturing systems engineer. Tried to take zofran but no improvement in symptoms. ? ?Review of Systems  ?Positive: Headache, nausea, vomiting ?Negative: Fever, diarrhea ? ?Physical Exam  ?BP (!) 178/132 (BP Location: Left Arm)   Pulse 79   Temp 98.8 ?F (37.1 ?C) (Oral)   Resp 18   Ht 5\' 6"  (1.676 m)   Wt (!) 139.7 kg   SpO2 98%   BMI 49.71 kg/m?  ?Gen:   Awake, no distress   ?Resp:  Normal effort  ?MSK:   Moves extremities without difficulty  ?Other:   ? ?Medical Decision Making  ?Medically screening exam initiated at 9:31 PM.  Appropriate orders placed.  Essica Kiker was informed that the remainder of the evaluation will be completed by another provider, this initial triage assessment does not replace that evaluation, and the importance of remaining in the ED until their evaluation is complete. ? ? ?  ?[name], PA-C ?07/16/21 2134 ? ?

## 2021-07-16 NOTE — ED Notes (Signed)
Unsuccessful IV attempt x2 L FA and L AC ?

## 2021-07-16 NOTE — ED Triage Notes (Signed)
Patient complains of headache, general malaise, and emesis that started a few days ago. Patient took 500mg  tylenol earlier today. Patient is alert, oriented, speaking in complete sentences, and in no apparent distress at this time. Denies history of hypertension, BP 158/117 in triage. ?

## 2021-07-17 ENCOUNTER — Emergency Department (HOSPITAL_COMMUNITY): Payer: Medicaid Other

## 2021-07-17 LAB — COMPREHENSIVE METABOLIC PANEL
ALT: 24 U/L (ref 0–44)
AST: 21 U/L (ref 15–41)
Albumin: 3.7 g/dL (ref 3.5–5.0)
Alkaline Phosphatase: 45 U/L (ref 38–126)
Anion gap: 9 (ref 5–15)
BUN: 14 mg/dL (ref 6–20)
CO2: 24 mmol/L (ref 22–32)
Calcium: 9 mg/dL (ref 8.9–10.3)
Chloride: 103 mmol/L (ref 98–111)
Creatinine, Ser: 0.87 mg/dL (ref 0.44–1.00)
GFR, Estimated: >60 mL/min (ref >60)
Glucose, Bld: 91 mg/dL (ref 70–99)
Potassium: 3.6 mmol/L (ref 3.5–5.1)
Sodium: 136 mmol/L (ref 135–145)
Total Bilirubin: 0.7 mg/dL (ref 0.3–1.2)
Total Protein: 7.6 g/dL (ref 6.5–8.1)

## 2021-07-17 LAB — CBC
HCT: 33.9 % — ABNORMAL LOW (ref 36.0–46.0)
Hemoglobin: 9.6 g/dL — ABNORMAL LOW (ref 12.0–15.0)
MCH: 20.4 pg — ABNORMAL LOW (ref 26.0–34.0)
MCHC: 28.3 g/dL — ABNORMAL LOW (ref 30.0–36.0)
MCV: 72 fL — ABNORMAL LOW (ref 80.0–100.0)
Platelets: 377 10*3/uL (ref 150–400)
RBC: 4.71 MIL/uL (ref 3.87–5.11)
RDW: 17.7 % — ABNORMAL HIGH (ref 11.5–15.5)
WBC: 9.4 10*3/uL (ref 4.0–10.5)
nRBC: 0 % (ref 0.0–0.2)

## 2021-07-17 MED ORDER — DIPHENHYDRAMINE HCL 50 MG/ML IJ SOLN
12.5000 mg | Freq: Once | INTRAMUSCULAR | Status: AC
  Administered 2021-07-17: 12.5 mg via INTRAVENOUS
  Filled 2021-07-17: qty 1

## 2021-07-17 MED ORDER — FAMOTIDINE IN NACL 20-0.9 MG/50ML-% IV SOLN
20.0000 mg | Freq: Once | INTRAVENOUS | Status: AC
  Administered 2021-07-17: 20 mg via INTRAVENOUS
  Filled 2021-07-17: qty 50

## 2021-07-17 MED ORDER — PROCHLORPERAZINE EDISYLATE 10 MG/2ML IJ SOLN
10.0000 mg | Freq: Once | INTRAMUSCULAR | Status: AC
  Administered 2021-07-17: 10 mg via INTRAVENOUS
  Filled 2021-07-17: qty 2

## 2021-07-17 MED ORDER — ONDANSETRON 4 MG PO TBDP: 4.0000 mg | ORAL_TABLET | Freq: Three times a day (TID) | ORAL | 0 refills | Status: DC | PRN

## 2021-07-17 MED ORDER — SODIUM CHLORIDE 0.9 % IV BOLUS
1000.0000 mL | Freq: Once | INTRAVENOUS | Status: AC
  Administered 2021-07-17: 1000 mL via INTRAVENOUS

## 2021-07-17 MED ORDER — BENZONATATE 100 MG PO CAPS: 100.0000 mg | ORAL_CAPSULE | Freq: Three times a day (TID) | ORAL | 0 refills | Status: DC

## 2021-07-17 MED ORDER — FAMOTIDINE 20 MG PO TABS: 20.0000 mg | ORAL_TABLET | Freq: Two times a day (BID) | ORAL | 0 refills | Status: DC | PRN

## 2021-07-17 NOTE — ED Notes (Addendum)
Pt was given apple juice and tolerated it ?

## 2021-07-17 NOTE — ED Provider Notes (Signed)
?[facility]-EMERGENCY DEPT ?Provider Note ? ? ?CSN: [id number] ?Arrival date & time: 07/16/21  2048 ? ?  ? ?History ? ?Chief Complaint  ?Patient presents with  ? Chest Pain  ? Abdominal Pain  ? Emesis  ? ? ?Danielle Hobbs is a 21 y.o. female with a hx of anxiety, depression, ADHD, and prediabetes who returns to the ED for persistently not feeling well.  ? ?Patient states that approximately 24 hours ago she woke up and had a migraine headache that was generalized, she also felt congested with a cough therefore she took some Nyquil. She subsequently vomited after taking the nyquil. Throughout the day she continued to feel poorly with headache, URI sxs, cough, chest discomfort, abdominal discomfort and N/V. She went to the ED and was given some ODT zofran which seemed to help and was ultimately discharged. She states she subsequently had additional vomiting, tried the PO zofran without relief, and has not been able to keep anything down. Given her persistent sxs she returned to the ED. Denies change in vision, numbness, focal weakness, diarrhea, hematemesis, hemoptysis, or dysuria.  ? ? ?HPI ? ?  ? ?Home Medications ?Prior to Admission medications   ?Medication Sig Start Date End Date Taking? Authorizing Provider  ?amphetamine-dextroamphetamine (ADDERALL XR) 20 MG 24 hr capsule Take 2 capsules in the morning 06/19/21   Shanna Cisco, NP  ?ARIPiprazole (ABILIFY) 10 MG tablet Take 1 tablet (10 mg total) by mouth daily. 06/19/21   Shanna Cisco, NP  ?busPIRone (BUSPAR) 15 MG tablet Take 1 tablet (15 mg total) by mouth 3 (three) times daily. 06/19/21   Shanna Cisco, NP  ?cetirizine (ZYRTEC) 10 MG tablet Take 1 tablet (10 mg total) by mouth daily. 07/04/17   Marca Ancona, MD  ?norgestimate-ethinyl estradiol (SPRINTEC 28) 0.25-35 MG-MCG tablet Take 1 tablet by mouth daily. 08/08/20   Roxy Horseman, MD  ?ondansetron (ZOFRAN) 4 MG tablet Take 1 tablet (4 mg total) by mouth every 8  (eight) hours as needed for nausea or vomiting. 07/16/21 08/15/21  Loeffler, Finis Bud, PA-C  ?sertraline (ZOLOFT) 100 MG tablet Take two tablets daily 06/19/21   Shanna Cisco, NP  ?traZODone (DESYREL) 150 MG tablet Take 1 tablet (150 mg total) by mouth at bedtime as needed for sleep. 06/19/21   Shanna Cisco, NP  ?   ? ?Allergies    ?Patient has no known allergies.   ? ?Review of Systems   ?Review of Systems  ?Constitutional:  Positive for chills. Negative for fever.  ?     Positive for intermittent sweats.   ?HENT:  Positive for congestion and sore throat. Negative for ear pain.   ?Eyes:  Negative for visual disturbance.  ?Respiratory:  Positive for cough (productive- yellow sputum.). Negative for shortness of breath.   ?Cardiovascular:  Positive for chest pain.  ?Gastrointestinal:  Positive for abdominal pain, nausea and vomiting. Negative for blood in stool and diarrhea.  ?Genitourinary:  Negative for dysuria, vaginal bleeding and vaginal discharge.  ?Musculoskeletal:  Positive for myalgias.  ?Neurological:  Positive for headaches. Negative for syncope, weakness and numbness.  ?All other systems reviewed and are negative. ? ?Physical Exam ?Updated Vital Signs ?BP (!) 143/100   Pulse 81   Temp 98.8 ?F (37.1 ?C) (Oral)   Resp 19   Ht 5\' 6"  (1.676 m)   Wt (!) 139.7 kg   SpO2 99%   BMI 49.71 kg/m?  ?Physical Exam ?Vitals and nursing note reviewed.  ?  Constitutional:   ?   General: She is not in acute distress. ?   Appearance: She is well-developed. She is not toxic-appearing.  ?HENT:  ?   Head: Normocephalic and atraumatic.  ?   Right Ear: Ear canal normal. Tympanic membrane is not perforated, erythematous, retracted or bulging.  ?   Left Ear: Ear canal normal. Tympanic membrane is not perforated, erythematous, retracted or bulging.  ?   Ears:  ?   Comments: No mastoid erythema/swelling/tenderness.  ?   Nose:  ?   Right Sinus: No maxillary sinus tenderness or frontal sinus tenderness.  ?   Left Sinus:  No maxillary sinus tenderness or frontal sinus tenderness.  ?   Mouth/Throat:  ?   Pharynx: Uvula midline. No oropharyngeal exudate or posterior oropharyngeal erythema.  ?   Comments: Posterior oropharynx is symmetric appearing. Patient tolerating own secretions without difficulty. No trismus. No drooling. No hot potato voice. No swelling beneath the tongue, submandibular compartment is soft.  ?Eyes:  ?   General: Vision grossly intact. Gaze aligned appropriately.     ?   Right eye: No discharge.     ?   Left eye: No discharge.  ?   Extraocular Movements: Extraocular movements intact.  ?   Conjunctiva/sclera: Conjunctivae normal.  ?   Pupils: Pupils are equal, round, and reactive to light.  ?   Comments: PERRL. No proptosis.   ?Cardiovascular:  ?   Rate and Rhythm: Normal rate and regular rhythm.  ?   Heart sounds: No murmur heard. ?Pulmonary:  ?   Effort: Pulmonary effort is normal. No respiratory distress.  ?   Breath sounds: Normal breath sounds. No wheezing, rhonchi or rales.  ?Chest:  ?   Chest wall: Tenderness (anterior chest wall which reproduces patient's pain) present.  ?Abdominal:  ?   General: There is no distension.  ?   Palpations: Abdomen is soft.  ?   Tenderness: There is abdominal tenderness (mild generalized). There is no guarding or rebound.  ?Musculoskeletal:  ?   Cervical back: Normal range of motion and neck supple. No edema or rigidity.  ?   Right lower leg: No tenderness. No edema.  ?   Left lower leg: No tenderness. No edema.  ?Lymphadenopathy:  ?   Cervical: No cervical adenopathy.  ?Skin: ?   General: Skin is warm and dry.  ?   Findings: No rash.  ?Neurological:  ?   Mental Status: She is alert.  ?   Comments: Alert. Clear speech. No facial droop. CNIII-XII grossly intact. Bilateral upper and lower extremities' sensation grossly intact. 5/5 symmetric strength with grip strength and with plantar and dorsi flexion bilaterally .   ?Psychiatric:     ?   Behavior: Behavior normal.  ? ? ?ED  Results / Procedures / Treatments   ?Labs ?(all labs ordered are listed, but only abnormal results are displayed) ?Labs Reviewed  ?CBC - Abnormal; Notable for the following components:  ?    Result Value  ? Hemoglobin 9.6 (*)   ? HCT 33.9 (*)   ? MCV 72.0 (*)   ? MCH 20.4 (*)   ? MCHC 28.3 (*)   ? RDW 17.7 (*)   ? All other components within normal limits  ?COMPREHENSIVE METABOLIC PANEL  ? ? ?EKG ?EKG Interpretation ? ?Date/Time:  Tuesday July 17 2021 03:28:52 EDT ?Ventricular Rate:  95 ?PR Interval:  161 ?QRS Duration: 104 ?QT Interval:  351 ?QTC Calculation: 442 ?R Axis:  64 ?Text Interpretation: Sinus rhythm Low voltage, precordial leads Borderline T wave abnormalities When compared with ECG of 02/28/2014, Nonspecific T wave abnormality is now present Confirmed by Dione BoozeGlick, David ([id number]) on 07/17/2021 3:45:21 AM ? ?Radiology ?DG Chest 2 View ? ?Result Date: 07/17/2021 ?CLINICAL DATA:  Cough. EXAM: CHEST - 2 VIEW COMPARISON:  Chest radiograph dated 08/22/2020. FINDINGS: The heart size and mediastinal contours are within normal limits. Both lungs are clear. The visualized skeletal structures are unremarkable. IMPRESSION: No active cardiopulmonary disease. Electronically Signed   By: Elgie CollardArash  Radparvar M.D.   On: 07/17/2021 02:15   ? ?Procedures ?Procedures  ? ? ?Medications Ordered in ED ?Medications  ?sodium chloride 0.9 % bolus 1,000 mL (1,000 mLs Intravenous New Bag/Given 07/17/21 0252)  ?prochlorperazine (COMPAZINE) injection 10 mg (10 mg Intravenous Given 07/17/21 0247)  ?diphenhydrAMINE (BENADRYL) injection 12.5 mg (12.5 mg Intravenous Given 07/17/21 0245)  ?famotidine (PEPCID) IVPB 20 mg premix (20 mg Intravenous New Bag/Given 07/17/21 0251)  ? ? ?ED Course/ Medical Decision Making/ A&P ?  ?                        ?Medical Decision Making ?Amount and/or Complexity of Data Reviewed ?Labs: ordered. ?Radiology: ordered. ? ?Risk ?Prescription drug management. ? ? ?Patient presents to the ED with complaints of flu like  sxs, this involves an extensive number of treatment options, and is a complaint that carries with it a high risk of complications and morbidity. Nontoxic, vitals w/ hypertension.  ? ?Additional history obtained:  ?Chart

## 2021-07-17 NOTE — Discharge Instructions (Addendum)
You were seen in the emergency department tonight for flulike illness.  Your blood test were overall reassuring, your blood counts do still show anemia which you have had in the past, please have this rechecked by primary care.  Your chest x-ray did not show pneumonia.  We are sending you home with the following medications: ?- Zofran ODT, please use every 8 hours as needed for nausea and vomiting, labs dissolvable your tongue, do not take this with the oral Zofran that you were prescribed earlier to swallow. ?-Pepcid: Take every 12 hours as needed for abdominal pain ?- Tessalon: Take every 8 hours as needed for coughing. ? ?We have prescribed you new medication(s) today. Discuss the medications prescribed today with your pharmacist as they can have adverse effects and interactions with your other medicines including over the counter and prescribed medications. Seek medical evaluation if you start to experience new or abnormal symptoms after taking one of these medicines, seek care immediately if you start to experience difficulty breathing, feeling of your throat closing, facial swelling, or rash as these could be indications of a more serious allergic reaction ? ?Additionally your blood pressure was noted to be elevated in the emergency department, it is very important that you have this rechecked by primary care. ? ?Please follow-up with primary care within 3 days.  Return to the emergency department for any new or worsening symptoms including but not limited to new or worsening pain, inability to keep fluids down, blood in vomit or stool, passing out, or any other concerns. ?

## 2021-07-31 ENCOUNTER — Ambulatory Visit (HOSPITAL_COMMUNITY): Payer: Medicaid Other | Admitting: Licensed Clinical Social Worker

## 2021-07-31 ENCOUNTER — Encounter (HOSPITAL_COMMUNITY): Payer: Self-pay

## 2021-08-14 ENCOUNTER — Other Ambulatory Visit (HOSPITAL_COMMUNITY): Payer: Self-pay | Admitting: Psychiatry

## 2021-08-14 ENCOUNTER — Telehealth (HOSPITAL_COMMUNITY): Payer: Self-pay | Admitting: *Deleted

## 2021-08-14 DIAGNOSIS — F900 Attention-deficit hyperactivity disorder, predominantly inattentive type: Secondary | ICD-10-CM

## 2021-08-14 MED ORDER — AMPHETAMINE-DEXTROAMPHET ER 20 MG PO CP24: ORAL_CAPSULE | ORAL | 0 refills | Status: AC

## 2021-08-14 NOTE — Telephone Encounter (Signed)
Refill of generic Adderall XR done, 2 daily, total 60 pills, [facility], [place] ?

## 2021-08-14 NOTE — Telephone Encounter (Signed)
VM from patient requesting medicine. I am assuming after reviewing the chart she is asking for her adderall as she should have been out mid March and has available refills on her other meds. Will forward this request to the provider covering for Brittney NP while she is on leave. She has a future appt on 09/03/21 and was last seen on 06/19/21 ?

## 2021-08-29 ENCOUNTER — Encounter (HOSPITAL_COMMUNITY): Payer: Self-pay

## 2021-08-29 ENCOUNTER — Ambulatory Visit (HOSPITAL_COMMUNITY): Payer: Medicaid Other | Admitting: Licensed Clinical Social Worker

## 2021-09-03 ENCOUNTER — Telehealth (HOSPITAL_COMMUNITY): Payer: Medicaid Other | Admitting: Psychiatry

## 2021-09-12 ENCOUNTER — Encounter (HOSPITAL_COMMUNITY): Payer: Self-pay

## 2021-09-12 ENCOUNTER — Telehealth (HOSPITAL_COMMUNITY): Payer: Medicaid Other | Admitting: Psychiatry

## 2021-11-14 ENCOUNTER — Ambulatory Visit: Payer: Medicaid Other | Admitting: Student

## 2021-11-14 NOTE — Progress Notes (Deleted)
Subjective:  Patient ID: Danielle Hobbs, female    DOB: 04/29/2001, 21 y.o.   MRN: [id number]  CC: New Patient  HPI:  Danielle Hobbs is a very pleasant 21 y.o. female who presents today to establish care.  Patient follows with psychiatry for ADHD, adjustment disorder and Depression. Is taking Abilify, BuSpar, Zoloft, Adderall, Trazodone    PMHx: Past Medical History:  Diagnosis Date   ADHD (attention deficit hyperactivity disorder)    Anxiety    Chronic constipation 01/15/2013   Depression    Phreesia 08/06/2020   Eczema 02/23/2014   Heart murmur    Phreesia 08/06/2020   Irregular heart rhythm 02/23/2014   Benign PVC cardiology evaluated and no change to ADHD meds needed     Oppositional defiant disorder    PCOS (polycystic ovarian syndrome)     Surgical Hx: Past Surgical History:  Procedure Laterality Date   ADENOIDECTOMY     TONSILLECTOMY      Family Hx: Family History  Problem Relation Age of Onset   Diabetes Maternal Grandmother    Drug abuse Maternal Grandmother    Bipolar disorder Maternal Grandmother    Depression Maternal Grandmother    Anxiety disorder Mother    Depression Mother    Hypertension Other     Social Hx: Current Social History   Who lives at home: *** 11/14/2021  Who would speak for you about health care matters: *** 11/14/2021  Transportation: *** 11/14/2021 Important Relationships & Pets: *** 11/14/2021  Current Stressors: *** 11/14/2021 Work / Education:  *** 11/14/2021 Religious / Personal Beliefs: *** 11/14/2021 Interests / Fun: *** 11/14/2021 Other: *** 11/14/2021   Medications:   ROS: Woman:  Patient reports no  vision/ hearing changes,anorexia, weight change, fever ,adenopathy, persistant / recurrent hoarseness, swallowing issues, chest pain, edema,persistant / recurrent cough, hemoptysis, dyspnea(rest, exertional, paroxysmal nocturnal), gastrointestinal  bleeding (melena, rectal bleeding), abdominal pain, excessive heart burn, GU  symptoms(dysuria, hematuria, pyuria, voiding/incontinence  Issues) syncope, focal weakness, severe memory loss, concerning skin lesions, depression, anxiety, abnormal bruising/bleeding, major joint swelling, breast masses or abnormal vaginal bleeding.    Man:  Patient reports no  vision/ hearing changes,anorexia, weight change, fever ,adenopathy, persistant / recurrent hoarseness, swallowing issues, chest pain, edema,persistant / recurrent cough, hemoptysis, dyspnea(rest, exertional, paroxysmal nocturnal), gastrointestinal  bleeding (melena, rectal bleeding), abdominal pain, excessive heart burn, GU symptoms(dysuria, hematuria, pyuria, voiding/incontinence  Issues) syncope, focal weakness, severe memory loss, concerning skin lesions, depression, anxiety, abnormal bruising/bleeding, major joint swelling.    Preventative Screening Needs yearly chlamydia testing and Hep C testing    Smoking status reviewed  ROS: pertinent noted in the HPI    Objective:  There were no vitals taken for this visit. Vitals and nursing note reviewed  General: NAD, pleasant, able to participate in exam HEENT: normocephalic, TM's visualized bilaterally, no scleral icterus or conjunctival pallor, no nasal discharge, moist mucous membranes, good dentition without erythema or discharge noted in posterior oropharynx Neck: supple, non-tender, without lymphadenopathy Cardiac: RRR, S1 S2 present. normal heart sounds, no murmurs. Respiratory: CTAB, normal effort, No wheezes, rales or rhonchi Abdomen: Normoactive bowel sounds, non-tender, non-distended, no hepatosplenomegaly Extremities: no edema or cyanosis. Skin: warm and dry, no rashes noted Neuro: alert, no obvious focal deficits Psych: Normal affect and mood  Assessment & Plan:  No problem-specific Assessment & Plan notes found for this encounter.   No orders of the defined types were placed in this encounter.  No orders of the defined types were placed in this  encounter.  No follow-ups on file. Alfredo Martinez, MD PGY-2 Family Medicine

## 2021-12-21 ENCOUNTER — Other Ambulatory Visit: Payer: Self-pay

## 2021-12-21 ENCOUNTER — Emergency Department (HOSPITAL_COMMUNITY)
Admission: EM | Admit: 2021-12-21 | Discharge: 2021-12-22 | Disposition: A | Discharge status: Home / Self Care | Payer: Medicaid Other | Attending: Emergency Medicine | Admitting: Emergency Medicine

## 2021-12-21 DIAGNOSIS — R197 Diarrhea, unspecified: Secondary | ICD-10-CM | POA: Diagnosis not present

## 2021-12-21 DIAGNOSIS — Z20822 Contact with and (suspected) exposure to covid-19: Secondary | ICD-10-CM | POA: Diagnosis not present

## 2021-12-21 DIAGNOSIS — R519 Headache, unspecified: Secondary | ICD-10-CM | POA: Diagnosis present

## 2021-12-21 DIAGNOSIS — H538 Other visual disturbances: Secondary | ICD-10-CM | POA: Diagnosis not present

## 2021-12-21 DIAGNOSIS — H9203 Otalgia, bilateral: Secondary | ICD-10-CM | POA: Diagnosis not present

## 2021-12-21 MED ORDER — DIPHENHYDRAMINE HCL 50 MG/ML IJ SOLN
12.5000 mg | Freq: Once | INTRAMUSCULAR | Status: AC
  Administered 2021-12-22: 12.5 mg via INTRAVENOUS
  Filled 2021-12-21: qty 1

## 2021-12-21 MED ORDER — PROCHLORPERAZINE EDISYLATE 10 MG/2ML IJ SOLN
10.0000 mg | Freq: Once | INTRAMUSCULAR | Status: AC
  Administered 2021-12-22: 10 mg via INTRAVENOUS
  Filled 2021-12-21: qty 2

## 2021-12-21 MED ORDER — ACETAMINOPHEN 500 MG PO TABS
1000.0000 mg | ORAL_TABLET | Freq: Once | ORAL | Status: DC
  Filled 2021-12-21: qty 2

## 2021-12-21 MED ORDER — SODIUM CHLORIDE 0.9 % IV BOLUS
1000.0000 mL | Freq: Once | INTRAVENOUS | Status: AC
Start: 2021-12-22 — End: 2021-12-22
  Administered 2021-12-22: 1000 mL via INTRAVENOUS

## 2021-12-21 NOTE — ED Triage Notes (Signed)
Patient coming to ED for evaluation of headache, nasal congestion, and "mucus congestion in my chest."  C/o ringing in ears.  No reports of fever or cough.

## 2021-12-21 NOTE — ED Notes (Signed)
Patient called from lobby for triage.  No answer x 1

## 2021-12-22 ENCOUNTER — Emergency Department (HOSPITAL_COMMUNITY): Payer: Medicaid Other

## 2021-12-22 LAB — CBC WITH DIFFERENTIAL/PLATELET
Abs Immature Granulocytes: 0.07 10*3/uL (ref 0.00–0.07)
Basophils Absolute: 0 10*3/uL (ref 0.0–0.1)
Basophils Relative: 0 %
Eosinophils Absolute: 0.2 10*3/uL (ref 0.0–0.5)
Eosinophils Relative: 2 %
HCT: 34.4 % — ABNORMAL LOW (ref 36.0–46.0)
Hemoglobin: 9.7 g/dL — ABNORMAL LOW (ref 12.0–15.0)
Immature Granulocytes: 1 %
Lymphocytes Relative: 41 %
Lymphs Abs: 2.9 10*3/uL (ref 0.7–4.0)
MCH: 19.8 pg — ABNORMAL LOW (ref 26.0–34.0)
MCHC: 28.2 g/dL — ABNORMAL LOW (ref 30.0–36.0)
MCV: 70.1 fL — ABNORMAL LOW (ref 80.0–100.0)
Monocytes Absolute: 0.7 10*3/uL (ref 0.1–1.0)
Monocytes Relative: 10 %
Neutro Abs: 3.3 10*3/uL (ref 1.7–7.7)
Neutrophils Relative %: 46 %
Platelets: 361 10*3/uL (ref 150–400)
RBC: 4.91 MIL/uL (ref 3.87–5.11)
RDW: 18.5 % — ABNORMAL HIGH (ref 11.5–15.5)
WBC: 7.1 10*3/uL (ref 4.0–10.5)
nRBC: 0 % (ref 0.0–0.2)

## 2021-12-22 LAB — BASIC METABOLIC PANEL
Anion gap: 8 (ref 5–15)
BUN: 14 mg/dL (ref 6–20)
CO2: 24 mmol/L (ref 22–32)
Calcium: 9.6 mg/dL (ref 8.9–10.3)
Chloride: 107 mmol/L (ref 98–111)
Creatinine, Ser: 0.85 mg/dL (ref 0.44–1.00)
GFR, Estimated: >60 mL/min (ref >60)
Glucose, Bld: 88 mg/dL (ref 70–99)
Potassium: 3.7 mmol/L (ref 3.5–5.1)
Sodium: 139 mmol/L (ref 135–145)

## 2021-12-22 LAB — RESP PANEL BY RT-PCR (FLU A&B, COVID) ARPGX2
Influenza A by PCR: NEGATIVE
Influenza B by PCR: NEGATIVE
SARS Coronavirus 2 by RT PCR: NEGATIVE

## 2021-12-22 LAB — I-STAT BETA HCG BLOOD, ED (MC, WL, AP ONLY): I-stat hCG, quantitative: <5 m[IU]/mL (ref <5)

## 2021-12-22 MED ORDER — SODIUM CHLORIDE (PF) 0.9 % IJ SOLN
INTRAMUSCULAR | Status: AC
  Filled 2021-12-22: qty 50

## 2021-12-22 MED ORDER — IOHEXOL 350 MG/ML SOLN
75.0000 mL | Freq: Once | INTRAVENOUS | Status: AC | PRN
Start: 2021-12-22 — End: 2021-12-22
  Administered 2021-12-22: 75 mL via INTRAVENOUS

## 2021-12-22 NOTE — Discharge Instructions (Signed)
You were seen in the emergency department today for a headache.  Your blood work showed anemia similar to prior labs you have had done.  Your CT scan showed findings that could be suggestive of idiopathic intracranial hypertension-given these findings we would like you to follow-up closely with a neurologist.  We have sent a referral and provided information for a neurology office locally.  Please take Tylenol per over-the-counter dosing to help with your pain.  Have your blood pressure rechecked by primary care as it was elevated in the ER tonight.  Please follow-up with primary care and neurology soon as possible.  Return to the emergency department immediately for new or worsening symptoms including but not limited to new worsening pain, change in your vision, numbness, weakness, dizziness, passing out, fever, or any other concerns.

## 2021-12-22 NOTE — ED Provider Notes (Signed)
[facility]-EMERGENCY DEPT Provider Note   CSN: [id number] Arrival date & time: 12/21/21  2139     History {Add pertinent medical, surgical, social history, OB history to HPI:1} Chief Complaint  Patient presents with   Headache    Danielle Hobbs is a 21 y.o. female with a history of ADHD, anxiety, depression, PCOS, and prediabetes who presents to the emergency department with complaints of headache for the past 4 days.  Patient reports frontal/generalized headache that has been waxing and waning in severity.  Seems worse if she is laying down.  No other alleviating or aggravating factors.  Today had some bilateral blurry vision which concerned her, this overall seems to be improving though.  Her current pain is a 5 out of 10.  She also has been having some bilateral ear pain as well as a whooshing sound in her ears.  Reports some loose stools as well.  Denies fever, chills, nasal congestion, hearing loss, vomiting, abdominal pain, dyspnea, numbness, tingling, weakness, or syncope.  HPI     Home Medications Prior to Admission medications   Medication Sig Start Date End Date Taking? Authorizing Provider  amphetamine-dextroamphetamine (ADDERALL XR) 20 MG 24 hr capsule Take 2 capsules in the morning 08/14/21   Nelly Rout, MD  ARIPiprazole (ABILIFY) 10 MG tablet Take 1 tablet (10 mg total) by mouth daily. 06/19/21   Shanna Cisco, NP  benzonatate (TESSALON) 100 MG capsule Take 1 capsule (100 mg total) by mouth every 8 (eight) hours. 07/17/21   Keisha Amer R, PA-C  busPIRone (BUSPAR) 15 MG tablet Take 1 tablet (15 mg total) by mouth 3 (three) times daily. 06/19/21   Shanna Cisco, NP  cetirizine (ZYRTEC) 10 MG tablet Take 1 tablet (10 mg total) by mouth daily. 07/04/17   Marca Ancona, MD  famotidine (PEPCID) 20 MG tablet Take 1 tablet (20 mg total) by mouth 2 (two) times daily as needed for heartburn or indigestion (abdominal pain). 07/17/21    Breauna Mazzeo, Pleas Koch, PA-C  norgestimate-ethinyl estradiol (SPRINTEC 28) 0.25-35 MG-MCG tablet Take 1 tablet by mouth daily. 08/08/20   Roxy Horseman, MD  ondansetron (ZOFRAN-ODT) 4 MG disintegrating tablet Take 1 tablet (4 mg total) by mouth every 8 (eight) hours as needed for nausea or vomiting. 07/17/21   Shacola Schussler, Lelon Mast R, PA-C  sertraline (ZOLOFT) 100 MG tablet Take two tablets daily 06/19/21   Shanna Cisco, NP  traZODone (DESYREL) 150 MG tablet Take 1 tablet (150 mg total) by mouth at bedtime as needed for sleep. 06/19/21   Shanna Cisco, NP      Allergies    Patient has no known allergies.    Review of Systems   Review of Systems  Constitutional:  Negative for chills and fever.  HENT:  Positive for ear pain. Negative for congestion.   Eyes:  Positive for visual disturbance.  Respiratory:  Negative for shortness of breath.   Gastrointestinal:  Positive for diarrhea. Negative for abdominal pain, blood in stool and vomiting.  Neurological:  Positive for headaches. Negative for syncope, weakness and numbness.  All other systems reviewed and are negative.   Physical Exam Updated Vital Signs BP (!) 140/117   Pulse 89   Temp 98.9 F (37.2 C) (Oral)   Resp 16   Ht 5\' 6"  (1.676 m)   Wt 108.9 kg   SpO2 99%   BMI 38.74 kg/m  Physical Exam Vitals and nursing note reviewed.  Constitutional:  General: She is not in acute distress.    Appearance: She is well-developed. She is not toxic-appearing.  HENT:     Head: Normocephalic and atraumatic.     Right Ear: Ear canal normal. Tympanic membrane is not perforated, erythematous, retracted or bulging.     Left Ear: Ear canal normal. Tympanic membrane is not perforated, erythematous, retracted or bulging.     Ears:     Comments: No mastoid erythema/swelling/tenderness.     Nose:     Right Sinus: No maxillary sinus tenderness or frontal sinus tenderness.     Left Sinus: No maxillary sinus tenderness or frontal  sinus tenderness.     Mouth/Throat:     Pharynx: Uvula midline. No oropharyngeal exudate or posterior oropharyngeal erythema.     Comments: Posterior oropharynx is symmetric appearing. Patient tolerating own secretions without difficulty. No trismus. No drooling. No hot potato voice. No swelling beneath the tongue, submandibular compartment is soft.  Eyes:     General: Vision grossly intact. Gaze aligned appropriately.        Right eye: No discharge.        Left eye: No discharge.     Extraocular Movements: Extraocular movements intact.     Conjunctiva/sclera: Conjunctivae normal.     Pupils: Pupils are equal, round, and reactive to light.     Comments: PERRL. No proptosis.   Cardiovascular:     Rate and Rhythm: Normal rate and regular rhythm.     Heart sounds: No murmur heard. Pulmonary:     Effort: Pulmonary effort is normal. No respiratory distress.     Breath sounds: Normal breath sounds. No wheezing, rhonchi or rales.  Abdominal:     General: There is no distension.     Palpations: Abdomen is soft.     Tenderness: There is no abdominal tenderness.  Musculoskeletal:     Cervical back: Normal range of motion and neck supple. No edema or rigidity.  Lymphadenopathy:     Cervical: No cervical adenopathy.  Skin:    General: Skin is warm and dry.     Findings: No rash.  Neurological:     Mental Status: She is alert.     Comments: Alert. Clear speech. No facial droop. CNIII-XII grossly intact. Bilateral upper and lower extremities' sensation grossly intact. 5/5 symmetric strength with grip strength and with plantar and dorsi flexion bilaterally . Normal finger to nose bilaterally. Negative pronator drift. Negative Romberg sign. Gait is steady and intact.   Psychiatric:        Behavior: Behavior normal.     ED Results / Procedures / Treatments   Labs (all labs ordered are listed, but only abnormal results are displayed) Labs Reviewed  RESP PANEL BY RT-PCR (FLU A&B, COVID) ARPGX2   CBC WITH DIFFERENTIAL/PLATELET  BASIC METABOLIC PANEL  I-STAT BETA HCG BLOOD, ED ([facility], [facility], [facility] ONLY)    EKG None  Radiology No results found.  Procedures Procedures  {Document cardiac monitor, telemetry assessment procedure when appropriate:1}  Medications Ordered in ED Medications  acetaminophen (TYLENOL) tablet 1,000 mg (0 mg Oral Hold 12/21/21 2351)  sodium chloride 0.9 % bolus 1,000 mL (has no administration in time range)  prochlorperazine (COMPAZINE) injection 10 mg (has no administration in time range)  diphenhydrAMINE (BENADRYL) injection 12.5 mg (has no administration in time range)    ED Course/ Medical Decision Making/ A&P  Medical Decision Making Amount and/or Complexity of Data Reviewed Labs: ordered. Radiology: ordered.  Risk Prescription drug management.   ***  {Document critical care time when appropriate:1} {Document review of labs and clinical decision tools ie heart score, Chads2Vasc2 etc:1}  {Document your independent review of radiology images, and any outside records:1} {Document your discussion with family members, caretakers, and with consultants:1} {Document social determinants of health affecting pt's care:1} {Document your decision making why or why not admission, treatments were needed:1} Final Clinical Impression(s) / ED Diagnoses Final diagnoses:  None    Rx / DC Orders ED Discharge Orders     None

## 2022-01-02 ENCOUNTER — Encounter: Payer: Self-pay | Admitting: Psychiatry

## 2022-01-02 ENCOUNTER — Ambulatory Visit (INDEPENDENT_AMBULATORY_CARE_PROVIDER_SITE_OTHER): Payer: Medicaid Other | Admitting: Psychiatry

## 2022-01-02 VITALS — BP 152/117 | HR 81 | Ht 66.0 in | Wt 307.2 lb

## 2022-01-02 DIAGNOSIS — H538 Other visual disturbances: Secondary | ICD-10-CM

## 2022-01-02 DIAGNOSIS — Z0000 Encounter for general adult medical examination without abnormal findings: Secondary | ICD-10-CM

## 2022-01-02 NOTE — Progress Notes (Signed)
Referring:  Cherly Anderson, PA-C [address] [place],  [place] [address]  PCP: Pcp, No  Neurology was asked to evaluate Danielle Hobbs, a 21 year old female for a chief complaint of headaches.  Our recommendations of care will be communicated by shared medical record.    CC:  headaches  History provided from self  HPI:  Medical co-morbidities: ADHD, anxiety, prediabetes, PCOS  The patient presents for evaluation of headaches which began 3 weeks ago. No clear inciting event for onset of headache. Headaches were initially constant, then slowly improved in frequency. Now she is having 2-3 headaches per week. Headaches are associated with blurry vision, though this has improved over the past couple of weeks. She will also have phonophobia and nausea. She has also been hearing whooshing sounds in the left ear. Headaches are worse when she lies down. Takes Excedrin as needed which does help.  She presented to the ED 12/21/21 where CTA head/neck was unremarkable. CTH showed a partially empty sella and narrowing of the distal transverse sinuses.  She does have a history of headaches, but nothing as severe as her current headache. Has never been diagnosed with migraines.  Headache History: Onset: 3 weeks ago Triggers: lying down worsens headache Aura: blurry vision Location: occiput, frontal Quality/Description: pressure Associated Symptoms:  Photophobia: no  Phonophobia: yes  Nausea: yes Worse with activity?: yes Duration of headaches: 1 hour with Excedrin  Headache days per month: 11 Headache free days per month: 19  Current Treatment: Abortive Excedrin  Preventative none  Prior Therapies                                 Excedrin   LABS: CBC    Component Value Date/Time   WBC 7.1 12/22/2021 0015   RBC 4.91 12/22/2021 0015   HGB 9.7 (L) 12/22/2021 0015   HCT 34.4 (L) 12/22/2021 0015   PLT 361 12/22/2021 0015   MCV 70.1 (L) 12/22/2021 0015   MCH 19.8 (L)  12/22/2021 0015   MCHC 28.2 (L) 12/22/2021 0015   RDW 18.5 (H) 12/22/2021 0015   LYMPHSABS 2.9 12/22/2021 0015   MONOABS 0.7 12/22/2021 0015   EOSABS 0.2 12/22/2021 0015   BASOSABS 0.0 12/22/2021 0015      Latest Ref Rng & Units 12/22/2021   12:15 AM 07/17/2021    1:58 AM 07/16/2021    1:16 PM  CMP  Glucose 70 - 99 mg/dL 88  91  95   BUN 6 - 20 mg/dL 14  14  10    Creatinine 0.44 - 1.00 mg/dL  8.52  7.78   Sodium 135 - 145 mmol/L 139  136  137   Potassium 3.5 - 5.1 mmol/L 3.7  3.6  4.3   Chloride 98 - 111 mmol/L 107  103  105   CO2 22 - 32 mmol/L 24  24  24    Calcium 8.9 - 10.3 mg/dL 9.6  9.0  9.0   Total Protein 6.5 - 8.1 g/dL  7.6  6.8   Total Bilirubin 0.3 - 1.2 mg/dL  0.7  1.1   Alkaline Phos 38 - 126 U/L  45  45   AST 15 - 41 U/L  21  29   ALT 0 - 44 U/L  24  24      IMAGING:  CTH/CTA head/neck: 1.  No acute intracranial process. 2.  No intracranial large vessel occlusion  or significant stenosis. 3. Evaluation of the arch and proximal carotid and vertebral arteries is limited by photon starvation from overlying soft tissues. Within this limitation, no evidence of dissection or hemodynamically significant stenosis in the neck. 4. Partial empty sella and narrowing of the distal transverse sinuses near the transverse sigmoid junction, concerning for idiopathic intracranial hypertension. Correlate with symptoms and consider lumbar puncture with opening pressure if clinically indicated.  Imaging independently reviewed on January 02, 2022   Current Outpatient Medications on File Prior to Visit  Medication Sig Dispense Refill   amphetamine-dextroamphetamine (ADDERALL XR) 20 MG 24 hr capsule Take 2 capsules in the morning 60 capsule 0   ARIPiprazole (ABILIFY) 10 MG tablet Take 1 tablet (10 mg total) by mouth daily. 30 tablet 3   benzonatate (TESSALON) 100 MG capsule Take 1 capsule (100 mg total) by mouth every 8 (eight) hours. 21 capsule 0   busPIRone (BUSPAR) 15 MG  tablet Take 1 tablet (15 mg total) by mouth 3 (three) times daily. 90 tablet 3   cetirizine (ZYRTEC) 10 MG tablet Take 1 tablet (10 mg total) by mouth daily. 30 tablet 3   famotidine (PEPCID) 20 MG tablet Take 1 tablet (20 mg total) by mouth 2 (two) times daily as needed for heartburn or indigestion (abdominal pain). 15 tablet 0   norgestimate-ethinyl estradiol (SPRINTEC 28) 0.25-35 MG-MCG tablet Take 1 tablet by mouth daily. 30 tablet 4   ondansetron (ZOFRAN-ODT) 4 MG disintegrating tablet Take 1 tablet (4 mg total) by mouth every 8 (eight) hours as needed for nausea or vomiting. 5 tablet 0   sertraline (ZOLOFT) 100 MG tablet Take two tablets daily 60 tablet 3   traZODone (DESYREL) 150 MG tablet Take 1 tablet (150 mg total) by mouth at bedtime as needed for sleep. 30 tablet 3   No current facility-administered medications on file prior to visit.     Allergies: No Known Allergies  Family History: Family History  Problem Relation Age of Onset   Diabetes Maternal Grandmother    Drug abuse Maternal Grandmother    Bipolar disorder Maternal Grandmother    Depression Maternal Grandmother    Anxiety disorder Mother    Depression Mother    Hypertension Other      Past Medical History: Past Medical History:  Diagnosis Date   ADHD (attention deficit hyperactivity disorder)    Anxiety    Chronic constipation 01/15/2013   Depression    Phreesia 08/06/2020   Eczema 02/23/2014   Headache    Heart murmur    Phreesia 08/06/2020   Irregular heart rhythm 02/23/2014   Benign PVC cardiology evaluated and no change to ADHD meds needed     Oppositional defiant disorder    PCOS (polycystic ovarian syndrome)     Past Surgical History Past Surgical History:  Procedure Laterality Date   ADENOIDECTOMY     TONSILLECTOMY      Social History: Social History   Tobacco Use   Smoking status: Never    Passive exposure: Yes   Smokeless tobacco: Never   Tobacco comments:    Moms smoke in  house  Vaping Use   Vaping Use: Never used  Substance Use Topics   Alcohol use: Not Currently   Drug use: Yes    Types: Marijuana    ROS: Negative for fevers, chills. Positive for headaches, blurred vision, pulsatile tinnitus. All other systems reviewed and negative unless stated otherwise in HPI.   Physical Exam:   Vital Signs: BP (!) 152/117  Pulse 81   Ht 5\' 6"  (1.676 m)   Wt (!) 307 lb 3.2 oz (139.3 kg)   BMI 49.58 kg/m  GENERAL: well appearing,in no acute distress,alert SKIN:  Color, texture, turgor normal. No rashes or lesions HEAD:  Normocephalic/atraumatic. CV:  RRR RESP: Normal respiratory effort MSK: no tenderness to palpation over occiput, neck, or shoulders  NEUROLOGICAL: Mental Status: Alert, oriented to person, place and time,Follows commands Cranial Nerves: PERRL, no papilledema visualized, visual fields intact to confrontation, extraocular movements intact, facial sensation intact, no facial droop or ptosis, hearing grossly intact, no dysarthria Motor: muscle strength 5/5 both upper and lower extremities Reflexes: 2+ throughout Sensation: intact to light touch all 4 extremities Coordination: Finger-to- nose-finger intact bilaterally Gait: normal-based   IMPRESSION: 21 year old female with a history of ADHD, anxiety, prediabetes, PCOS who presents for evaluation of headaches and blurred vision. CTH with partially empty sella and narrowing of transverse sinuses. She does not appear to have papilledema on fundus exam today, however she does endorse multiple symptoms of increased intracranial pressure including positional headaches and pulsatile tinnitus. Will refer to ophthalmology for a formal eye exam. She currently feels her headaches are well-controlled with PRN Excedrin twice per week. She will let me know if headaches become more frequent/severe and she would like to try prescription medication.  PLAN: -Referral to ophthalmology for formal eye  exam -Next steps: will plan for LP if papilledema seen on ophtho exam, will treat for migraine if no papilledema seen   I spent a total of 24 minutes chart reviewing and counseling the patient. Headache education was done. Written educational materials and patient instructions outlining all of the above were given.  Follow-up: 3 months   Genia Harold, MD 01/02/2022   2:28 PM

## 2022-01-03 ENCOUNTER — Telehealth: Payer: Self-pay | Admitting: Psychiatry

## 2022-01-03 NOTE — Telephone Encounter (Signed)
Sent to Dr. Groat ph # [phone] 

## 2022-02-12 ENCOUNTER — Ambulatory Visit: Payer: Medicaid Other | Admitting: Nurse Practitioner

## 2022-02-28 ENCOUNTER — Ambulatory Visit: Payer: Medicaid Other | Admitting: Physician Assistant

## 2022-03-05 ENCOUNTER — Ambulatory Visit: Payer: Medicaid Other | Admitting: Nurse Practitioner

## 2022-04-26 ENCOUNTER — Telehealth: Payer: Medicaid Other | Admitting: Physician Assistant

## 2022-04-26 DIAGNOSIS — R6889 Other general symptoms and signs: Secondary | ICD-10-CM | POA: Diagnosis not present

## 2022-04-26 MED ORDER — BENZONATATE 100 MG PO CAPS: 100.0000 mg | ORAL_CAPSULE | Freq: Three times a day (TID) | ORAL | 0 refills | Status: DC | PRN

## 2022-04-26 MED ORDER — FLUTICASONE PROPIONATE 50 MCG/ACT NA SUSP: 2.0000 | Freq: Every day | NASAL | 0 refills | Status: DC

## 2022-04-26 NOTE — Progress Notes (Signed)
E visit for Flu like symptoms   We are sorry that you are not feeling well.  Here is how we plan to help! Based on what you have shared with me it looks like you may have flu-like symptoms that should be watched but do not seem to indicate anti-viral treatment.  Influenza or "the flu" is   an infection caused by a respiratory virus. The flu virus is highly contagious and persons who did not receive their yearly flu vaccination may "catch" the flu from close contact.  We have anti-viral medications to treat the viruses that cause this infection. They are not a "cure" and only shorten the course of the infection. These prescriptions are most effective when they are given within the first 2 days of "flu" symptoms. Antiviral medication are indicated if you have a high risk of complications from the flu. You should  also consider an antiviral medication if you are in close contact with someone who is at risk. These medications can help patients avoid complications from the flu  but have side effects that you should know. Possible side effects from Tamiflu or oseltamivir include nausea, vomiting, diarrhea, dizziness, headaches, eye redness, sleep problems or other respiratory symptoms. You should not take Tamiflu if you have an allergy to oseltamivir or any to the ingredients in Tamiflu.  Based upon your symptoms and potential risk factors I recommend that you follow the flu symptoms recommendation that I have listed below.  This is an infection that is most likely caused by a virus. There are no specific treatments other than to help you with the symptoms until the infection runs its course.  We are sorry you are not feeling well.  Here is how we plan to help!  For nasal congestion, you may use an oral decongestants such as Mucinex D or if you have glaucoma or high blood pressure use plain Mucinex.  Saline nasal spray or nasal drops can help and can safely be used as often as needed for congestion.  For  your congestion, I have prescribed Fluticasone nasal spray one spray in each nostril twice a day  If you do not have a history of heart disease, hypertension, diabetes or thyroid disease, prostate/bladder issues or glaucoma, you may also use Sudafed to treat nasal congestion.  It is highly recommended that you consult with a pharmacist or your primary care physician to ensure this medication is safe for you to take.     If you have a cough, you may use cough suppressants such as Delsym and Robitussin.  If you have glaucoma or high blood pressure, you can also use Coricidin HBP.   For cough I have prescribed for you A prescription cough medication called Tessalon Perles 100 mg. You may take 1-2 capsules every 8 hours as needed for cough  If you have a sore or scratchy throat, use a saltwater gargle-  to  teaspoon of salt dissolved in a 4-ounce to 8-ounce glass of warm water.  Gargle the solution for approximately 15-30 seconds and then spit.  It is important not to swallow the solution.  You can also use throat lozenges/cough drops and Chloraseptic spray to help with throat pain or discomfort.  Warm or cold liquids can also be helpful in relieving throat pain.  For headache, pain or general discomfort, you can use Ibuprofen or Tylenol as directed.   Some authorities believe that zinc sprays or the use of Echinacea may shorten the course of your symptoms.     ANYONE WHO HAS FLU SYMPTOMS SHOULD: Stay home. The flu is highly contagious and going out or to work exposes others! Be sure to drink plenty of fluids. Water is fine as well as fruit juices, sodas and electrolyte beverages. You may want to stay away from caffeine or alcohol. If you are nauseated, try taking small sips of liquids. How do you know if you are getting enough fluid? Your urine should be a pale yellow or almost colorless. Get rest. Taking a steamy shower or using a humidifier may help nasal congestion and ease sore throat pain. Using a  saline nasal spray works much the same way. Cough drops, hard candies and sore throat lozenges may ease your cough. Line up a caregiver. Have someone check on you regularly.   GET HELP RIGHT AWAY IF: You cannot keep down liquids or your medications. You become short of breath Your fell like you are going to pass out or loose consciousness. Your symptoms persist after you have completed your treatment plan MAKE SURE YOU  Understand these instructions. Will watch your condition. Will get help right away if you are not doing well or get worse.  Your e-visit answers were reviewed by a board certified advanced clinical practitioner to complete your personal care plan.  Depending on the condition, your plan could have included both over the counter or prescription medications.  If there is a problem please reply  once you have received a response from your provider.  Your safety is important to us.  If you have drug allergies check your prescription carefully.    You can use MyChart to ask questions about today's visit, request a non-urgent call back, or ask for a work or school excuse for 24 hours related to this e-Visit. If it has been greater than 24 hours you will need to follow up with your provider, or enter a new e-Visit to address those concerns.  You will get an e-mail in the next two days asking about your experience.  I hope that your e-visit has been valuable and will speed your recovery. Thank you for using e-visits.  I have spent 5 minutes in review of e-visit questionnaire, review and updating patient chart, medical decision making and response to patient.   Norina Cowper M Starlee Corralejo, PA-C  

## 2022-06-06 ENCOUNTER — Telehealth: Payer: Medicaid Other | Admitting: Family Medicine

## 2022-06-06 NOTE — Progress Notes (Signed)
The patient no-showed for appointment despite this provider sending direct link, reaching out via phone with no response and waiting for at least 10 minutes from appointment time for patient to join. They will be marked as a NS for this appointment/time.   Danielle Hobbs M Lilleigh Hechavarria, NP    

## 2022-06-14 ENCOUNTER — Ambulatory Visit: Payer: Medicaid Other | Admitting: Student

## 2022-08-20 ENCOUNTER — Ambulatory Visit (INDEPENDENT_AMBULATORY_CARE_PROVIDER_SITE_OTHER): Payer: Medicaid Other | Admitting: Family Medicine

## 2022-08-20 ENCOUNTER — Encounter: Payer: Self-pay | Admitting: Family Medicine

## 2022-08-20 VITALS — BP 142/103 | HR 91 | Temp 98.4°F | Ht 66.0 in | Wt 302.6 lb

## 2022-08-20 DIAGNOSIS — I10 Essential (primary) hypertension: Secondary | ICD-10-CM | POA: Insufficient documentation

## 2022-08-20 DIAGNOSIS — R7303 Prediabetes: Secondary | ICD-10-CM

## 2022-08-20 DIAGNOSIS — E282 Polycystic ovarian syndrome: Secondary | ICD-10-CM | POA: Diagnosis not present

## 2022-08-20 DIAGNOSIS — F3342 Major depressive disorder, recurrent, in full remission: Secondary | ICD-10-CM | POA: Diagnosis not present

## 2022-08-20 DIAGNOSIS — I159 Secondary hypertension, unspecified: Secondary | ICD-10-CM | POA: Diagnosis present

## 2022-08-20 DIAGNOSIS — F3341 Major depressive disorder, recurrent, in partial remission: Secondary | ICD-10-CM | POA: Diagnosis not present

## 2022-08-20 DIAGNOSIS — M549 Dorsalgia, unspecified: Secondary | ICD-10-CM | POA: Insufficient documentation

## 2022-08-20 DIAGNOSIS — D649 Anemia, unspecified: Secondary | ICD-10-CM

## 2022-08-20 DIAGNOSIS — M545 Low back pain, unspecified: Secondary | ICD-10-CM | POA: Diagnosis not present

## 2022-08-20 MED ORDER — AMLODIPINE BESYLATE 5 MG PO TABS: 5.0000 mg | ORAL_TABLET | Freq: Every day | ORAL | 0 refills | Status: DC

## 2022-08-20 MED ORDER — SERTRALINE HCL 100 MG PO TABS: ORAL_TABLET | ORAL | 3 refills | Status: DC

## 2022-08-20 NOTE — Assessment & Plan Note (Signed)
Per chart review, persistently anemia with Hgb ~9-10. Most likely menstrual source but given irregular periods will pursue further work up -Anemia panel

## 2022-08-20 NOTE — Assessment & Plan Note (Signed)
BP 142/103 on repeat today. Multiple elevated readings on record to 150s. -Start Amlodipine  -Recommend checking BP daily at home and returning with BP log at next appointment

## 2022-08-20 NOTE — Assessment & Plan Note (Addendum)
Chronic lower cervical/upper thoracic and lumbar back pain. No inciting injury but patient feels related to large chest. No prior imaging. No red flag symptoms.  -Refer to PT for core strengthening and mobility exercise -Refer to Plastic surgery for evaluation of breast reduction at patient request -Pain management with alternating Tylenol and Ibuprofen as needed and massage -Symptoms likely to improve with weight loss

## 2022-08-20 NOTE — Assessment & Plan Note (Signed)
Irregular menstruation. Not currently on birth control or combined hormonal therapy. Plan to discuss formal diagnosis and management at next visit

## 2022-08-20 NOTE — Progress Notes (Addendum)
Subjective:    Patient ID: Danielle Hobbs, female    DOB: [birth year], 22 y.o.   MRN: [id number]   CC: New Patient  Back/neck pain Chronic issue since high school. Pain localized to lower back and upper back. Using Ibuprofen when needed. Imaging done in 2022 for scoliosis but no other imaging of spine. Reports difficulty moving her neck at times. Interested in breast reduction surgery as she thinks her breast size is contributing to back discomfort. Active at work, walks a lot but no specific strengthening exercises.  Hypertension: - Medications: None currently. - Checking BP at home: Yes has cuff - checks once per month, 150/avg - Denies any SOB, vision changes, LE edema. Occasional sharp chest pain, last occurred around 2 weeks ago and usually happens a couple times per month  Behavioral health H/o ADHD, GAD, MDD and recently diagnosed with BPD. Not currently on medication management, states she has been out of meds for the last 3 months. Would like to get reestablishes with psychiatry.  Concerns to discuss at next visit: PCOS Birth control counseling  HPI: PMHx: Past Medical History:  Diagnosis Date   ADHD (attention deficit hyperactivity disorder)    Anxiety    Borderline personality disorder    Depression    Phreesia 08/06/2020   Eczema 02/23/2014   Headache    Heart murmur    Phreesia 08/06/2020   Irregular heart rhythm 02/23/2014   Benign PVC cardiology evaluated and no change to ADHD meds needed     PCOS (polycystic ovarian syndrome)      Surgical Hx: Past Surgical History:  Procedure Laterality Date   ADENOIDECTOMY     TONSILLECTOMY       Family Hx: Family History  Problem Relation Age of Onset   Anxiety disorder Mother    Depression Mother    Diabetes Maternal Grandmother    Drug abuse Maternal Grandmother    Bipolar disorder Maternal Grandmother    Depression Maternal Grandmother    Hypertension Other    Colon cancer Maternal Great-grandmother       Social Hx: Current Social History 08/20/2022   Who lives at home: Mom and 2nd cousin  Who would speak for you about health care matters: Mom - Architect: mom's car, wants independent transport options  Important Relationships & Pets: None  Current Stressors: Housing - would like own place  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 Neck: Supple, non-tender Cardiovascular: RRR without murmur Respiratory: CTAB. Normal WOB on RA Gastrointestinal: Soft, non-tender, non-distended MSK: No peripheral edema. TTP over lower cervical spinous process and paraspinal musculature. Neck ROM limited by pain. TTP over lumbar spinous process and paraspinal musculature. Lateral bending and twisting limited by pain. Derm: Warm, dry, no rashes noted Neuro: Motor and sensation intact globally. Psych: Cooperative, pleasant  Assessment & Plan:   MDD (major depressive disorder), recurrent, in partial remission (HCC) PHQ9: 21, denies SI. H/o MDD, previously taking Sertraline  and reports it worked well for her. Denies symptoms of mania or concern for bipolar disorder. -Refer to psychiatry (provided list of providers that accept insurance) -Restart Sertraline   Prediabetes A1c 5.8 in 2020, prediabetic -A1c, lipid panel  PCOS (polycystic ovarian  syndrome) Irregular menstruation. Not currently on birth control or combined hormonal therapy. Plan to discuss formal diagnosis and management at next visit  Hypertension BP 142/103 on repeat today. Multiple elevated readings on record to 150s. -Start Amlodipine  -Recommend checking BP daily at home and returning with BP log at next appointment  Anemia Per chart review, persistently anemia with Hgb ~9-10. Most likely menstrual source but given irregular periods will pursue further work up -Anemia panel  Back pain Chronic lower cervical/upper thoracic and lumbar back pain. No inciting injury but patient feels related to large chest. No prior imaging. No red flag symptoms.  -Refer to PT for core strengthening and mobility exercise -Refer to Plastic surgery for evaluation of breast reduction at patient request -Pain management with alternating Tylenol and Ibuprofen as needed and massage -Symptoms likely to improve with weight loss   Will be 22 at next visit - due for first PAP. Discuss TDaP vaccination at that time  Elberta Fortis, DO

## 2022-08-20 NOTE — Assessment & Plan Note (Signed)
A1c 5.8 in 2020, prediabetic -A1c, lipid panel

## 2022-08-20 NOTE — Assessment & Plan Note (Signed)
PHQ9: 21, denies SI. H/o MDD, previously taking Sertraline  and reports it worked well for her. Denies symptoms of mania or concern for bipolar disorder. -Refer to psychiatry (provided list of providers that accept insurance) -Restart Sertraline 

## 2022-08-20 NOTE — Patient Instructions (Signed)
It was wonderful to see you today! Thank you for choosing [facility].   Please bring ALL of your medications with you to every visit.   Today we talked about:  I referred you to physical therapy for your back pain and placed a referral to plastic surgery to discuss breast reduction. We are checking labs to monitor your anemia today. We are also checking labs to check for diabetes and high cholesterol today. I will follow up with you about these results Your blood pressure is high today and I would recommend starting medication. Please take Amlodipine  daily.  Please follow up in 3-4 weeks   We are checking some labs today. If they are abnormal, I will call you. If they are normal, I will send you a MyChart message (if it is active) or a letter in the mail. If you do not hear about your labs in the next 2 weeks, please call the office.  Call the clinic at [phone] if your symptoms worsen or you have any concerns.  Please be sure to schedule follow up at the front desk before you leave today.   Elberta Fortis, DO Family Medicine     Psychiatry Resource List (Adults and Children) Most of these providers will take Medicaid. please consult your insurance for a complete and updated list of available providers. When calling to make an appointment have your insurance information available to confirm you are covered.   [facility]:Psychiatry and Counseling [address] [place], [place] [address] [phone]  [facility]  [address] [place], [place] [phone] Crisis [phone]   [facility]:   [facility]: [address]     [phone]   [facility]: [address] [place]. [place],        [phone] [place]: [phone],    [phone] 5 [place]: [phone] [address],                   [phone] Children: [facility]  [address]         [phone]  [facility] (virtual only) [phone]   [facility]  (Psychiatry only; Adults /children 12 and over, will take Medicaid)  [address] [place] [address], [place], [place] [address]       [phone]   [facility] (Psychiatry & counseling ; adults & children ; will take Medicaid [address]  [place] [address]  Go on-line to complete referral ( [email or web address] [phone]    (Spanish speaking therapists)  [facility]  Psychiatry & counseling; Adults and children;  Call Registration prior to scheduling an appointment [phone] [address]    [place], [place] [address]    [phone]  [facility] (Psychiatry & counseling; adults & children; Medicare no Medicaid)  [address] [place] [address]   [place], [place]  [address]      [phone]    [facility] (up to age 16)  Psychiatry & counseling ,will take Medicaid, must do counseling to receive psychiatry services  [address] [place] [address]        [phone]  [facility] (Psychiatry & counseling; adults & children; will take Medicaid) Will need a referral from provider [address],  [place], [place]  [phone]   [facility] --- Walk-In Mon-Friday 8am-3pm (  will take Medicaid, Psychiatry, Adults & children,  [address], [place], [place]   [phone]   [facility] [place], Walk-in M-F 8am-12pm and 1pm -3pm   (Counseling, Psychiatry, will take Medicaid, adults & children)  [address] [place], [place]  [phone]

## 2022-08-21 LAB — ANEMIA PROFILE B
Basophils Absolute: 0 10*3/uL (ref 0.0–0.2)
Basos: 1 %
EOS (ABSOLUTE): 0.1 10*3/uL (ref 0.0–0.4)
Eos: 2 %
Ferritin: 6 ng/mL — ABNORMAL LOW (ref 15–150)
Folate: 5.6 ng/mL (ref >3.0)
Hematocrit: 30.5 % — ABNORMAL LOW (ref 34.0–46.6)
Hemoglobin: 8.7 g/dL — ABNORMAL LOW (ref 11.1–15.9)
Immature Grans (Abs): 0 10*3/uL (ref 0.0–0.1)
Immature Granulocytes: 0 %
Iron Saturation: 4 % — CL (ref 15–55)
Iron: 17 ug/dL — ABNORMAL LOW (ref 27–159)
Lymphocytes Absolute: 1.9 10*3/uL (ref 0.7–3.1)
Lymphs: 31 %
MCH: 19.3 pg — ABNORMAL LOW (ref 26.6–33.0)
MCHC: 28.5 g/dL — ABNORMAL LOW (ref 31.5–35.7)
MCV: 68 fL — ABNORMAL LOW (ref 79–97)
Monocytes Absolute: 0.5 10*3/uL (ref 0.1–0.9)
Monocytes: 8 %
Neutrophils Absolute: 3.6 10*3/uL (ref 1.4–7.0)
Neutrophils: 58 %
Platelets: 380 10*3/uL (ref 150–450)
RBC: 4.51 x10E6/uL (ref 3.77–5.28)
RDW: 18 % — ABNORMAL HIGH (ref 11.7–15.4)
Retic Ct Pct: 1.5 % (ref 0.6–2.6)
Total Iron Binding Capacity: 432 ug/dL (ref 250–450)
UIBC: 415 ug/dL (ref 131–425)
Vitamin B-12: 377 pg/mL (ref 232–1245)
WBC: 6.1 10*3/uL (ref 3.4–10.8)

## 2022-08-21 LAB — HEMOGLOBIN A1C
Est. average glucose Bld gHb Est-mCnc: 105 mg/dL
Hgb A1c MFr Bld: 5.3 % (ref 4.8–5.6)

## 2022-08-21 LAB — LIPID PANEL
Chol/HDL Ratio: 2.9 ratio (ref 0.0–4.4)
Cholesterol, Total: 149 mg/dL (ref 100–199)
HDL: 51 mg/dL (ref >39)
LDL Chol Calc (NIH): 87 mg/dL (ref 0–99)
Triglycerides: 54 mg/dL (ref 0–149)
VLDL Cholesterol Cal: 11 mg/dL (ref 5–40)

## 2022-08-23 ENCOUNTER — Telehealth: Payer: Self-pay | Admitting: Family Medicine

## 2022-08-23 DIAGNOSIS — D509 Iron deficiency anemia, unspecified: Secondary | ICD-10-CM

## 2022-08-23 MED ORDER — FERROUS SULFATE 325 (65 FE) MG PO TBEC: 325.0000 mg | DELAYED_RELEASE_TABLET | ORAL | 0 refills | Status: DC

## 2022-08-23 NOTE — Telephone Encounter (Signed)
Attempted to call patient regarding lab results x2, no answer. Left voicemail  Hgb 8.7, ferritin 6 and iron 17, consistent with IDA. Recommend starting oral iron supplementation and taking every other day to reduce risk of constipation. Will send in prescription to pharmacy on file but patient can purchase over the counter if desired. Will discuss potential causes at follow up appointment on 09/06/2022.  Elberta Fortis, DO

## 2022-08-23 NOTE — Telephone Encounter (Signed)
Patient returns call to nurse line. Advised of message per provider.   No further questions at this time.   Veronda Prude, RN

## 2022-09-05 NOTE — Progress Notes (Signed)
    SUBJECTIVE:   CHIEF COMPLAINT / HPI:   Hypertension: - Medications: Amlodipine 5mg  - Compliance: Yes - has been taking for 1 week - Checking BP at home: Not checking - has cuff at home - Denies any SOB, CP, vision changes, LE edema, medication SEs, or symptoms of hypotension  PCOS Birth control Pap Patient reports history of super heavy periods since she started menstruating in 3rd grade. They occur every 28 day and she has bleeding for 7 days. Passing very large clots.Hgb 8.7 in 08/2022, not taking oral iron.  PERTINENT  PMH / PSH: ***  OBJECTIVE:   BP (!) 151/109   Pulse 92   Wt (!) 306 lb 3.2 oz (138.9 kg)   LMP 08/17/2022   SpO2 100%   BMI 49.42 kg/m    General: alert & oriented, no apparent distress, well groomed HEENT: normocephalic, atraumatic, moist mucus membranes, neck supple Respiratory: normal respiratory effort GI: non-distended Skin: no rashes, no jaundice Psych: appropriate mood and affect   ASSESSMENT/PLAN:   No problem-specific Assessment & Plan notes found for this encounter.     Dr. Elberta Fortis, DO [facility]    {    This will disappear when note is signed, click to select method of visit    :1}

## 2022-09-06 ENCOUNTER — Ambulatory Visit: Payer: Medicaid Other | Admitting: Family Medicine

## 2022-09-06 ENCOUNTER — Other Ambulatory Visit: Payer: Self-pay

## 2022-09-06 VITALS — BP 109/75 | HR 92 | Wt 306.2 lb

## 2022-09-06 DIAGNOSIS — F3341 Major depressive disorder, recurrent, in partial remission: Secondary | ICD-10-CM

## 2022-09-06 DIAGNOSIS — Z23 Encounter for immunization: Secondary | ICD-10-CM

## 2022-09-06 DIAGNOSIS — M549 Dorsalgia, unspecified: Secondary | ICD-10-CM

## 2022-09-06 DIAGNOSIS — Z0000 Encounter for general adult medical examination without abnormal findings: Secondary | ICD-10-CM | POA: Diagnosis not present

## 2022-09-06 DIAGNOSIS — I10 Essential (primary) hypertension: Secondary | ICD-10-CM | POA: Diagnosis not present

## 2022-09-06 DIAGNOSIS — I159 Secondary hypertension, unspecified: Secondary | ICD-10-CM

## 2022-09-06 DIAGNOSIS — D509 Iron deficiency anemia, unspecified: Secondary | ICD-10-CM | POA: Diagnosis not present

## 2022-09-06 MED ORDER — FERROUS SULFATE 325 (65 FE) MG PO TBEC: 325.0000 mg | DELAYED_RELEASE_TABLET | ORAL | 0 refills | Status: AC

## 2022-09-06 NOTE — Patient Instructions (Addendum)
It was wonderful to see you today! Thank you for choosing [facility].   Please bring ALL of your medications with you to every visit.   Today we talked about:  Given your heavy periods, I do think getting on some kind of birth control will help with your anemia. Please check out [facility] for birth control options. I think an estrogen-containing options such as the pill, patch or ring or an IUD would be the best options for period control. We can do your Pap with STI testing whenever you feel ready. Please take your blood pressure medication every day. It would be helpful to take you r blood pressure at home so we know whether to adjust your medications. I would take once daily and please see the log below for instruction on how to take it. Please start taking the oral iron supplement every other day for your anemia. If you insurance does not cover it, you can always get it from [facility] for around $2 for 90 pills. If you do not hear back about physical therapy in the next 2 weeks please call our office so we can check on the referral.  Please follow up for pap and BP check in the next 3-4 weeks.  Call the clinic at [phone] if your symptoms worsen or you have any concerns.  Please be sure to schedule follow up at the front desk before you leave today.   Elberta Fortis, DO Family Medicine    Blood Pressure Record Sheet To take your blood pressure, you will need a blood pressure machine. You can buy a blood pressure machine (blood pressure monitor) at your clinic, drug store, or online. When choosing one, consider: An automatic monitor that has an arm cuff. A cuff that wraps snugly around your upper arm. You should be able to fit only one finger between your arm and the cuff. A device that stores blood pressure reading results. Do not choose a monitor that measures your blood pressure from your wrist or finger. Follow your health care provider's instructions for  how to take your blood pressure. To use this form: Take your blood pressure medications every day These measurements should be taken when you have been at rest for at least 10-15 min Take at least 2 readings with each blood pressure check. This makes sure the results are correct. Wait 1-2 minutes between measurements. Write down the results in the spaces on this form. Keep in mind it should always be recorded systolic over diastolic. Both numbers are important.  Repeat this every day for 2-3 weeks, or as told by your health care provider.  Make a follow-up appointment with your health care provider to discuss the results.  Blood Pressure Log Date Medications taken? (Y/N) Blood Pressure Time of Day

## 2022-09-07 IMAGING — DX DG SCOLIOSIS EVAL COMPLETE SPINE 1V
1 series · 1 of 1 positions shown · non-contrast
Comparison: None.

CLINICAL DATA: Scoliosis.

EXAM:
DG SCOLIOSIS EVAL COMPLETE SPINE 1V

[dg scoliosis ap]
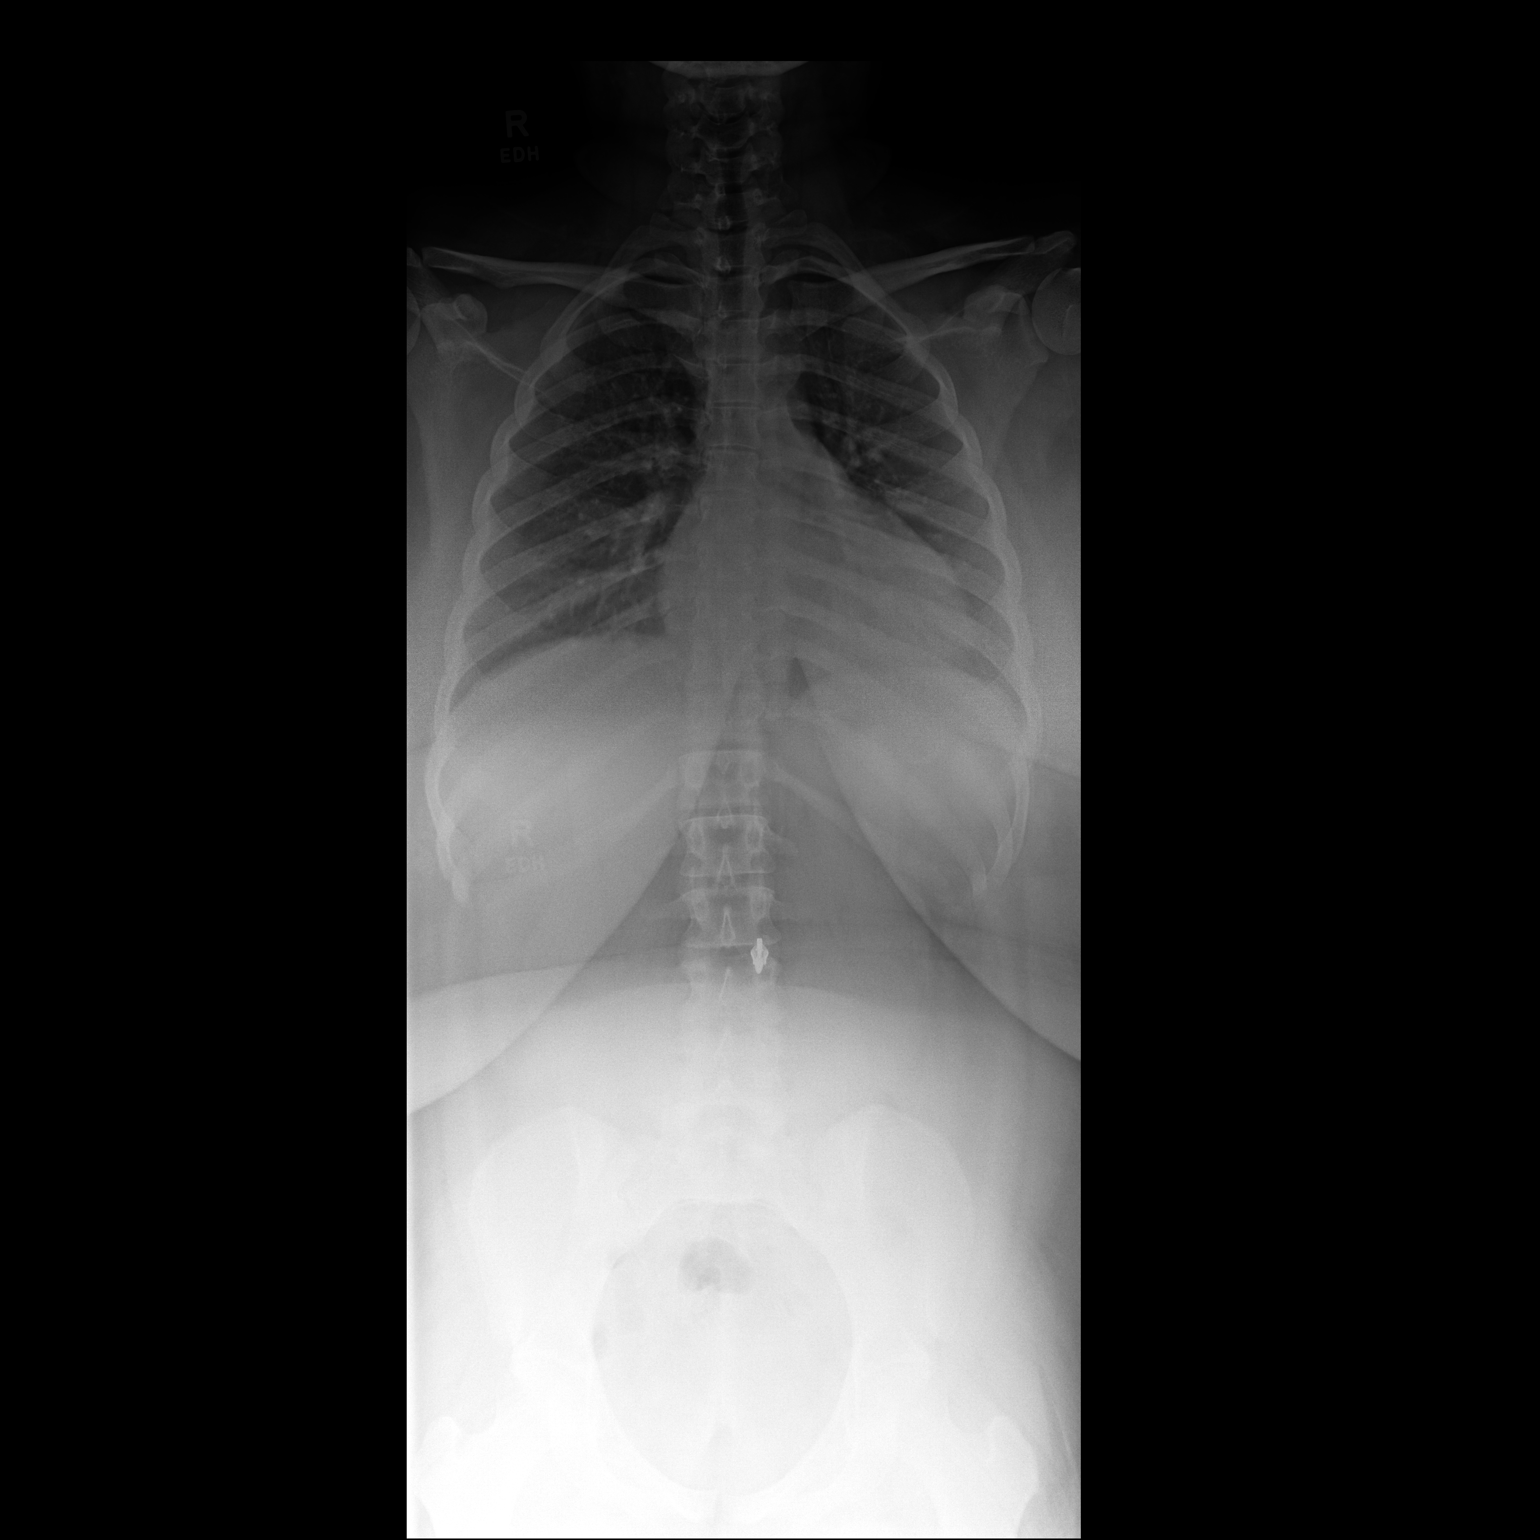

[1 of 1 positions shown; findings below may reference images not displayed]

FINDINGS: Very mild gradual right convex thoracolumbar scoliosis estimated at
8 degrees. No vertebral body anomalies.
IMPRESSION: Very mild gradual right convex thoracolumbar scoliosis estimated at
8 degrees.

## 2022-09-08 DIAGNOSIS — Z0000 Encounter for general adult medical examination without abnormal findings: Secondary | ICD-10-CM | POA: Insufficient documentation

## 2022-09-08 NOTE — Assessment & Plan Note (Addendum)
BP initially elevated, normal upon repeat. Compliant on Amlodipine 5mg  x 1 weeks, denies side effects. -Keep BP log at home daily -F/u BP check in 3-4 weeks

## 2022-09-08 NOTE — Assessment & Plan Note (Signed)
Mother trying to find psychiatrist for patient. Taking Sertraline 100mg  daily, initially some side effects that have since resolved.

## 2022-09-08 NOTE — Assessment & Plan Note (Signed)
Hgb 8.7 in 08/2022, recommended to start oral iron but patient has not picked it up. Likely in the setting of heavy menstrual bleeding, passing large clots. Recommend hormonal birth control for excess bleeding, patient considering options -Rx Ferrous Sulfate 325mg  every other day -Provided birth control information as patient wants to do additional research. Plan to start at f/u or sooner if patient desires

## 2022-09-08 NOTE — Assessment & Plan Note (Addendum)
-  Scheduled with Plastic Surgery for evaluation of breast reduction for back pain.  -Scheduled for PT evaluation

## 2022-09-08 NOTE — Assessment & Plan Note (Addendum)
-  Due for pap, deferred today due to patient preference, will perform at follow up visit. STI testing at that time -TDaP today

## 2022-09-11 ENCOUNTER — Ambulatory Visit: Payer: Medicaid Other | Attending: Family Medicine | Admitting: Physical Therapy

## 2022-09-12 ENCOUNTER — Ambulatory Visit: Payer: Medicaid Other

## 2022-09-13 ENCOUNTER — Emergency Department (HOSPITAL_BASED_OUTPATIENT_CLINIC_OR_DEPARTMENT_OTHER)
Admission: EM | Admit: 2022-09-13 | Discharge: 2022-09-13 | Disposition: A | Discharge status: Home / Self Care | Payer: Medicaid Other | Source: Home / Self Care | Attending: Emergency Medicine | Admitting: Emergency Medicine

## 2022-09-13 ENCOUNTER — Other Ambulatory Visit (HOSPITAL_BASED_OUTPATIENT_CLINIC_OR_DEPARTMENT_OTHER): Payer: Self-pay

## 2022-09-13 ENCOUNTER — Encounter (HOSPITAL_BASED_OUTPATIENT_CLINIC_OR_DEPARTMENT_OTHER): Payer: Self-pay | Admitting: *Deleted

## 2022-09-13 ENCOUNTER — Emergency Department (HOSPITAL_BASED_OUTPATIENT_CLINIC_OR_DEPARTMENT_OTHER): Payer: Medicaid Other

## 2022-09-13 ENCOUNTER — Emergency Department (HOSPITAL_BASED_OUTPATIENT_CLINIC_OR_DEPARTMENT_OTHER)
Admission: EM | Admit: 2022-09-13 | Discharge: 2022-09-13 | Disposition: A | Discharge status: Home / Self Care | Payer: Medicaid Other | Attending: Emergency Medicine | Admitting: Emergency Medicine

## 2022-09-13 ENCOUNTER — Other Ambulatory Visit: Payer: Self-pay

## 2022-09-13 ENCOUNTER — Encounter (HOSPITAL_BASED_OUTPATIENT_CLINIC_OR_DEPARTMENT_OTHER): Payer: Self-pay | Admitting: Emergency Medicine

## 2022-09-13 DIAGNOSIS — R109 Unspecified abdominal pain: Secondary | ICD-10-CM | POA: Diagnosis present

## 2022-09-13 DIAGNOSIS — Z79899 Other long term (current) drug therapy: Secondary | ICD-10-CM | POA: Insufficient documentation

## 2022-09-13 DIAGNOSIS — R1011 Right upper quadrant pain: Secondary | ICD-10-CM | POA: Insufficient documentation

## 2022-09-13 DIAGNOSIS — R1031 Right lower quadrant pain: Secondary | ICD-10-CM | POA: Insufficient documentation

## 2022-09-13 DIAGNOSIS — I10 Essential (primary) hypertension: Secondary | ICD-10-CM | POA: Insufficient documentation

## 2022-09-13 HISTORY — DX: Essential (primary) hypertension: I10

## 2022-09-13 LAB — URINALYSIS, ROUTINE W REFLEX MICROSCOPIC
Bacteria, UA: NONE SEEN
Bilirubin Urine: NEGATIVE
Glucose, UA: NEGATIVE mg/dL
Ketones, ur: NEGATIVE mg/dL
Leukocytes,Ua: NEGATIVE
Nitrite: NEGATIVE
Specific Gravity, Urine: 1.018 (ref 1.005–1.030)
pH: 5.5 (ref 5.0–8.0)

## 2022-09-13 LAB — CBC WITH DIFFERENTIAL/PLATELET
Abs Immature Granulocytes: 0.03 10*3/uL (ref 0.00–0.07)
Basophils Absolute: 0 10*3/uL (ref 0.0–0.1)
Basophils Relative: 1 %
Eosinophils Absolute: 0.2 10*3/uL (ref 0.0–0.5)
Eosinophils Relative: 5 %
HCT: 31.1 % — ABNORMAL LOW (ref 36.0–46.0)
Hemoglobin: 8.7 g/dL — ABNORMAL LOW (ref 12.0–15.0)
Immature Granulocytes: 1 %
Lymphocytes Relative: 41 %
Lymphs Abs: 2 10*3/uL (ref 0.7–4.0)
MCH: 19 pg — ABNORMAL LOW (ref 26.0–34.0)
MCHC: 28 g/dL — ABNORMAL LOW (ref 30.0–36.0)
MCV: 67.8 fL — ABNORMAL LOW (ref 80.0–100.0)
Monocytes Absolute: 0.5 10*3/uL (ref 0.1–1.0)
Monocytes Relative: 10 %
Neutro Abs: 2 10*3/uL (ref 1.7–7.7)
Neutrophils Relative %: 42 %
Platelets: 254 10*3/uL (ref 150–400)
RBC: 4.59 MIL/uL (ref 3.87–5.11)
RDW: 18.9 % — ABNORMAL HIGH (ref 11.5–15.5)
WBC: 4.8 10*3/uL (ref 4.0–10.5)
nRBC: 0 % (ref 0.0–0.2)

## 2022-09-13 LAB — COMPREHENSIVE METABOLIC PANEL
ALT: 28 U/L (ref 0–44)
AST: 22 U/L (ref 15–41)
Albumin: 4 g/dL (ref 3.5–5.0)
Alkaline Phosphatase: 53 U/L (ref 38–126)
Anion gap: 9 (ref 5–15)
BUN: 12 mg/dL (ref 6–20)
CO2: 23 mmol/L (ref 22–32)
Calcium: 8.9 mg/dL (ref 8.9–10.3)
Chloride: 105 mmol/L (ref 98–111)
Creatinine, Ser: 0.74 mg/dL (ref 0.44–1.00)
GFR, Estimated: >60 mL/min (ref >60)
Glucose, Bld: 98 mg/dL (ref 70–99)
Potassium: 3.8 mmol/L (ref 3.5–5.1)
Sodium: 137 mmol/L (ref 135–145)
Total Bilirubin: 0.5 mg/dL (ref 0.3–1.2)
Total Protein: 7.6 g/dL (ref 6.5–8.1)

## 2022-09-13 LAB — LIPASE, BLOOD: Lipase: 20 U/L (ref 11–51)

## 2022-09-13 LAB — PREGNANCY, URINE: Preg Test, Ur: NEGATIVE

## 2022-09-13 MED ORDER — FAMOTIDINE 20 MG PO TABS: 20.0000 mg | ORAL_TABLET | Freq: Two times a day (BID) | ORAL | 0 refills | Status: AC

## 2022-09-13 MED ORDER — IOHEXOL 300 MG/ML  SOLN
100.0000 mL | Freq: Once | INTRAMUSCULAR | Status: AC | PRN
  Administered 2022-09-13: 80 mL via INTRAVENOUS

## 2022-09-13 MED ORDER — ONDANSETRON HCL 4 MG PO TABS: 4.0000 mg | ORAL_TABLET | Freq: Four times a day (QID) | ORAL | 0 refills | Status: AC

## 2022-09-13 MED ORDER — SODIUM CHLORIDE 0.9 % IV BOLUS
1000.0000 mL | Freq: Once | INTRAVENOUS | Status: AC
  Administered 2022-09-13: 1000 mL via INTRAVENOUS

## 2022-09-13 MED ORDER — ONDANSETRON 4 MG PO TBDP
4.0000 mg | ORAL_TABLET | Freq: Once | ORAL | Status: AC
Start: 2022-09-13 — End: 2022-09-13
  Administered 2022-09-13: 4 mg via ORAL
  Filled 2022-09-13: qty 1

## 2022-09-13 MED ORDER — OXYCODONE-ACETAMINOPHEN 5-325 MG PO TABS
1.0000 | ORAL_TABLET | Freq: Once | ORAL | Status: AC
  Administered 2022-09-13: 1 via ORAL
  Filled 2022-09-13: qty 1

## 2022-09-13 MED ORDER — PANTOPRAZOLE SODIUM 20 MG PO TBEC: 20.0000 mg | DELAYED_RELEASE_TABLET | Freq: Every day | ORAL | 0 refills | Status: AC

## 2022-09-13 NOTE — ED Triage Notes (Signed)
Pt states that she was seen this am for right side pain and "had every test possible" and was discharged.  Pt states that she got home and has been feeling worse and having nausea and vomiting. Pt reports right sided pain which wraps around from RUQ to right flank, pain increases with movement and deep breathing.

## 2022-09-13 NOTE — ED Triage Notes (Signed)
Pt arrived POV from home, caox4, ambulatory in triage, NAD c/o HTN despite being started on amlodipine one month ago. Pt reports she has been compliant with meds. Pt also c/o R flank pain that started 4 days ago that worsens on movement and position changes. Pt reports increased urination, but denies any additional urinary complaints.

## 2022-09-13 NOTE — Discharge Instructions (Addendum)
It was a pleasure taking care of you today!  Your lab and imaging studies did not show any concerning emergent findings at this time.  At home you may alternate over-the-counter Tylenol and ibuprofen as needed for your symptoms.  You may place ice or heat to the affected area to 15 minutes at a time, sure to place a barrier between your skin and the ice/heat.  Attached is a blood pressure log to keep track of your blood pressures.  Maintain your scheduled follow-up appoint with your primary care provider as scheduled on 09/16/2022.  It is important that you pick up your iron supplements and take them as prescribed.  Return to the emergency department if you experience increasing/worsening symptoms.

## 2022-09-13 NOTE — ED Notes (Signed)
Pt aware of the need for a urine... Unable to currently provide a urine... 

## 2022-09-13 NOTE — ED Notes (Signed)
RN reviewed discharge instructions with pt. Pt verbalized understanding and had no further questions. VSS upon discharge.  

## 2022-09-13 NOTE — ED Provider Notes (Signed)
[place] EMERGENCY DEPARTMENT AT [facility] Provider Note   CSN: [id number] Arrival date & time: 09/13/22  9562     History  Chief Complaint  Patient presents with   Hypertension    Danielle Hobbs is a 22 y.o. female with a PMHx of HTN who presents to the ED with concerns for elevated blood pressure onset 2 days. Patient notes she has been taking the Norvasc at x 2 weeks.  Has an appointment with her primary care provider on 09/16/2022.  Patient reports her blood pressure 2 days ago was 156/109 and yesterday it was 172/111.  Also concern for right-sided abdominal pain x 4 days.  Patient denies history of UTI or kidney stones.  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  Denies any other recent injury, trauma, fall.  Denies fever, urinary symptoms, constipation, diarrhea, nausea, vomiting, chest pain, shortness of breath.   The history is provided by the patient. No language interpreter was used.       Home Medications Prior to Admission medications   Medication Sig Start Date End Date Taking? Authorizing Provider  amLODipine (NORVASC) 5 MG tablet Take 1 tablet (5 mg total) by mouth at bedtime. 08/20/22   Elberta Fortis, MD  amphetamine-dextroamphetamine (ADDERALL XR) 20 MG 24 hr capsule Take 2 capsules in the morning 08/14/21   Nelly Rout, MD  ARIPiprazole (ABILIFY) 10 MG tablet Take 1 tablet (10 mg total) by mouth daily. 06/19/21   Shanna Cisco, NP  busPIRone (BUSPAR) 15 MG tablet Take 1 tablet (15 mg total) by mouth 3 (three) times daily. 06/19/21   Shanna Cisco, NP  cetirizine (ZYRTEC) 10 MG tablet Take 1 tablet (10 mg total) by mouth daily. 07/04/17   Marca Ancona, MD  ferrous sulfate 325 (65 FE) MG EC tablet Take 1 tablet (325 mg total) by mouth every other day. 09/06/22 03/05/23  Elberta Fortis, MD  sertraline (ZOLOFT) 100 MG tablet Take two tablets daily  08/20/22   Elberta Fortis, MD  traZODone (DESYREL) 150 MG tablet Take 1 tablet (150 mg total) by mouth at bedtime as needed for sleep. 06/19/21   Shanna Cisco, NP      Allergies    Patient has no known allergies.    Review of Systems   Review of Systems  Constitutional:  Negative for fever.  Gastrointestinal:  Positive for abdominal pain. Negative for constipation, diarrhea, nausea and vomiting.  Genitourinary:  Positive for vaginal bleeding (menstrual cycle). Negative for dysuria, hematuria and vaginal discharge.  All other systems reviewed and are negative.   Physical Exam Updated Vital Signs BP (!) 149/106 (BP Location: Left Arm)   Pulse 90   Temp 98.8 F (37.1 C) (Oral)   Resp 18   Ht 5\' 6"  (1.676 m)   Wt (!) 137.4 kg   LMP 08/17/2022   SpO2 99%   BMI 48.91 kg/m  Physical Exam Vitals and nursing note reviewed.  Constitutional:      General: She is not in acute distress.    Appearance: She is not diaphoretic.  HENT:     Head: Normocephalic and atraumatic.     Mouth/Throat:     Pharynx: No oropharyngeal exudate.  Eyes:     General: No scleral icterus.    Conjunctiva/sclera: Conjunctivae normal.  Cardiovascular:     Rate and Rhythm: Normal rate and regular rhythm.  Pulses: Normal pulses.     Heart sounds: Normal heart sounds.  Pulmonary:     Effort: Pulmonary effort is normal. No respiratory distress.     Breath sounds: Normal breath sounds. No wheezing.  Abdominal:     General: Bowel sounds are normal.     Palpations: Abdomen is soft. There is no mass.     Tenderness: There is abdominal tenderness in the right upper quadrant and right lower quadrant. There is no guarding or rebound.     Comments: Tenderness to palpation noted to right sided abdomen.  Musculoskeletal:        General: Normal range of motion.     Cervical back: Normal range of motion and neck supple.  Skin:    General: Skin is warm and dry.  Neurological:     Mental Status: She is  alert.  Psychiatric:        Behavior: Behavior normal.     ED Results / Procedures / Treatments   Labs (all labs ordered are listed, but only abnormal results are displayed) Labs Reviewed  CBC WITH DIFFERENTIAL/PLATELET - Abnormal; Notable for the following components:      Result Value   Hemoglobin 8.7 (*)    HCT 31.1 (*)    MCV 67.8 (*)    MCH 19.0 (*)    MCHC 28.0 (*)    RDW 18.9 (*)    All other components within normal limits  URINALYSIS, ROUTINE W REFLEX MICROSCOPIC - Abnormal; Notable for the following components:   APPearance HAZY (*)    Hgb urine dipstick LARGE (*)    Protein, ur TRACE (*)    All other components within normal limits  COMPREHENSIVE METABOLIC PANEL  LIPASE, BLOOD  PREGNANCY, URINE    EKG None  Radiology CT ABDOMEN PELVIS W CONTRAST  Result Date: 09/13/2022 CLINICAL DATA:  RIGHT lower quadrant abdominal pain. EXAM: CT ABDOMEN AND PELVIS WITH CONTRAST TECHNIQUE: Multidetector CT imaging of the abdomen and pelvis was performed using the standard protocol following bolus administration of intravenous contrast. RADIATION DOSE REDUCTION: This exam was performed according to the departmental dose-optimization program which includes automated exposure control, adjustment of the mA and/or kV according to patient size and/or use of iterative reconstruction technique. CONTRAST:  80mL OMNIPAQUE IOHEXOL 300 MG/ML  SOLN COMPARISON:  None Available. FINDINGS: Lower chest: Lung bases are clear. Hepatobiliary: No focal hepatic lesion. Normal gallbladder. No biliary duct dilatation. Common bile duct is normal. Pancreas: Pancreas is normal. No ductal dilatation. No pancreatic inflammation. Spleen: Normal spleen Adrenals/urinary tract: Adrenal glands and kidneys are normal. The ureters and bladder normal. Stomach/Bowel: Stomach, small bowel, appendix, and cecum are normal. The colon and rectosigmoid colon are normal. Vascular/Lymphatic: Abdominal aorta is normal caliber. No  periportal or retroperitoneal adenopathy. No pelvic adenopathy. Reproductive: Uterus and adnexa unremarkable. Other: No free fluid. Musculoskeletal: No aggressive osseous lesion. IMPRESSION: 1. No acute findings in the abdomen pelvis. 2. Normal appendix. 3. Normal uterus and adnexa. Electronically Signed   By: Genevive Bi M.D.   On: 09/13/2022 12:41    Procedures Procedures    Medications Ordered in ED Medications  sodium chloride 0.9 % bolus 1,000 mL (0 mLs Intravenous Stopped 09/13/22 1157)  iohexol (OMNIPAQUE) 300 MG/ML solution 100 mL (80 mLs Intravenous Contrast Given 09/13/22 1223)    ED Course/ Medical Decision Making/ A&P Clinical Course as of 09/13/22 1257  Fri Sep 13, 2022  1244 Re-evaluated and noted improvement of symptoms with treatment regimen. Discussed discharge treatment plan.  Pt agreeable at this time. Pt appears safe for discharge. [SB]    Clinical Course User Index [SB] Geofrey Silliman A, PA-C                              Medical Decision Making Amount and/or Complexity of Data Reviewed Labs: ordered. Radiology: ordered.  Risk Prescription drug management.   Patient presents to the emergency department with concerns for elevated blood pressure and abdominal pain.  Notes recent heavy lifting at her job.  On exam patient with tenderness to palpation noted to right sided abdomen.  Remainder of exam without acute findings.  Differential diagnosis includes cholecystitis, pancreatitis, diverticulitis, appendicitis, acute cystitis, pyelonephritis, nephrolithiasis.   Labs:  I ordered, and personally interpreted labs.  The pertinent results include:  Negative pregnancy urine Urinalysis notable for large amount of hemoglobin (patient is currently on her menstrual cycle) CBC with hemoglobin at 8.7, stable from previous value 3 weeks ago (patient was prescribed iron supplements, she has not began taking them yet) Lipase unremarkable CMP unremarkable  Imaging: I  ordered imaging studies including CT abdomen pelvis with I independently visualized and interpreted imaging which showed  1. No acute findings in the abdomen pelvis.  2. Normal appendix.  3. Normal uterus and adnexa.   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  Doubt concerns at this time for cholecystitis, pancreatitis, acute cystitis, appendicitis, diverticulitis, pyelonephritis, nephrolithiasis.  Patient blood pressure is stable from an emergent standpoint in the emergency department today.  In-depth conversation held with patient regarding elevated blood pressure and importance of taking her blood pressure at the same time each day, compliance with her medications as well as keeping a log to show her primary care provider for the scheduled appointment on 09/16/2022.  After consideration of the diagnostic results and the patients response to treatment, I feel that the patient would benefit from Discharge home.  Patient instructed to maintain her follow-up appoint with her primary care provider as scheduled on 09/16/2022.  Work note provided.  Patient instructed to take her prescribed iron supplements that was recently prescribed to her. Supportive care measures and strict return precautions discussed with patient at bedside. Pt acknowledges and verbalizes understanding. Pt appears safe for discharge. Follow up as indicated in discharge paperwork.   This chart was dictated using voice recognition software, Dragon. Despite the best efforts of this provider to proofread and correct  errors, errors may still occur which can change documentation meaning.   Final Clinical Impression(s) / ED Diagnoses Final diagnoses:  Right sided abdominal pain  Hypertension, unspecified type    Rx / DC Orders ED Discharge Orders     None         Zola Runion A, PA-C 09/13/22 1257    Curatolo, Adam, DO 09/13/22 1417

## 2022-09-13 NOTE — ED Provider Notes (Signed)
[place] EMERGENCY DEPARTMENT AT [facility] Provider Note   CSN: [id number] Arrival date & time: 09/13/22  1825     History  No chief complaint on file.   Danielle Hobbs is a 22 y.o. female past with history of ADHD, anxiety presents today for evaluation of abdominal pain.  Patient was evaluated here earlier today.  Workups including labs, imaging was negative.  States when she went home she had worsening abdominal pain after having dinner.  She has 1 episode of vomiting.  Denies any fever, chest pain, shortness of breath, bowel change, urinary symptoms, rash.  HPI    Past Medical History:  Diagnosis Date   ADHD (attention deficit hyperactivity disorder)    Anxiety    Borderline personality disorder (HCC)    Depression    [employer] 08/06/2020   Eczema 02/23/2014   Headache    Heart murmur    [employer] 08/06/2020   Hypertension    Irregular heart rhythm 02/23/2014   Benign PVC cardiology evaluated and no change to ADHD meds needed     PCOS (polycystic ovarian syndrome)    Past Surgical History:  Procedure Laterality Date   ADENOIDECTOMY     TONSILLECTOMY       Home Medications Prior to Admission medications   Medication Sig Start Date End Date Taking? Authorizing Provider  famotidine (PEPCID) 20 MG tablet Take 1 tablet (20 mg total) by mouth 2 (two) times daily. 09/13/22  Yes Jeanelle Malling, PA  ondansetron (ZOFRAN) 4 MG tablet Take 1 tablet (4 mg total) by mouth every 6 (six) hours. 09/13/22  Yes Jeanelle Malling, PA  pantoprazole (PROTONIX) 20 MG tablet Take 1 tablet (20 mg total) by mouth daily. 09/13/22  Yes Jeanelle Malling, PA  amLODipine (NORVASC) 5 MG tablet Take 1 tablet (5 mg total) by mouth at bedtime. 08/20/22   Elberta Fortis, MD  amphetamine-dextroamphetamine (ADDERALL XR) 20 MG 24 hr capsule Take 2 capsules in the morning 08/14/21   Nelly Rout, MD  ARIPiprazole (ABILIFY) 10 MG tablet Take 1 tablet (10 mg total) by mouth daily. 06/19/21   Shanna Cisco, NP   busPIRone (BUSPAR) 15 MG tablet Take 1 tablet (15 mg total) by mouth 3 (three) times daily. 06/19/21   Shanna Cisco, NP  cetirizine (ZYRTEC) 10 MG tablet Take 1 tablet (10 mg total) by mouth daily. 07/04/17   Marca Ancona, MD  ferrous sulfate 325 (65 FE) MG EC tablet Take 1 tablet (325 mg total) by mouth every other day. 09/06/22 03/05/23  Elberta Fortis, MD  sertraline (ZOLOFT) 100 MG tablet Take two tablets daily 08/20/22   Elberta Fortis, MD  traZODone (DESYREL) 150 MG tablet Take 1 tablet (150 mg total) by mouth at bedtime as needed for sleep. 06/19/21   Shanna Cisco, NP      Allergies    Patient has no known allergies.    Review of Systems   Review of Systems Negative except as per HPI.  Physical Exam Updated Vital Signs BP (!) 155/121 (BP Location: Right Arm)   Pulse 91   Temp 99.8 F (37.7 C) (Oral)   Resp 20   LMP 08/17/2022   SpO2 98%  Physical Exam Vitals and nursing note reviewed.  Constitutional:      Appearance: Normal appearance.  HENT:     Head: Normocephalic and atraumatic.     Mouth/Throat:     Mouth: Mucous membranes are moist.  Eyes:     General: No scleral icterus. Cardiovascular:  Rate and Rhythm: Normal rate and regular rhythm.     Pulses: Normal pulses.     Heart sounds: Normal heart sounds.  Pulmonary:     Effort: Pulmonary effort is normal.     Breath sounds: Normal breath sounds.  Abdominal:     General: Abdomen is flat.     Palpations: Abdomen is soft.     Tenderness: There is abdominal tenderness in the right upper quadrant and right lower quadrant.  Musculoskeletal:        General: No deformity.  Skin:    General: Skin is warm.     Findings: No rash.  Neurological:     General: No focal deficit present.     Mental Status: She is alert.  Psychiatric:        Mood and Affect: Mood normal.     ED Results / Procedures / Treatments   Labs (all labs ordered are listed, but only abnormal results are  displayed) Labs Reviewed - No data to display  EKG None  Radiology CT ABDOMEN PELVIS W CONTRAST  Result Date: 09/13/2022 CLINICAL DATA:  RIGHT lower quadrant abdominal pain. EXAM: CT ABDOMEN AND PELVIS WITH CONTRAST TECHNIQUE: Multidetector CT imaging of the abdomen and pelvis was performed using the standard protocol following bolus administration of intravenous contrast. RADIATION DOSE REDUCTION: This exam was performed according to the departmental dose-optimization program which includes automated exposure control, adjustment of the mA and/or kV according to patient size and/or use of iterative reconstruction technique. CONTRAST:  80mL OMNIPAQUE IOHEXOL 300 MG/ML  SOLN COMPARISON:  None Available. FINDINGS: Lower chest: Lung bases are clear. Hepatobiliary: No focal hepatic lesion. Normal gallbladder. No biliary duct dilatation. Common bile duct is normal. Pancreas: Pancreas is normal. No ductal dilatation. No pancreatic inflammation. Spleen: Normal spleen Adrenals/urinary tract: Adrenal glands and kidneys are normal. The ureters and bladder normal. Stomach/Bowel: Stomach, small bowel, appendix, and cecum are normal. The colon and rectosigmoid colon are normal. Vascular/Lymphatic: Abdominal aorta is normal caliber. No periportal or retroperitoneal adenopathy. No pelvic adenopathy. Reproductive: Uterus and adnexa unremarkable. Other: No free fluid. Musculoskeletal: No aggressive osseous lesion. IMPRESSION: 1. No acute findings in the abdomen pelvis. 2. Normal appendix. 3. Normal uterus and adnexa. Electronically Signed   By: Genevive Bi M.D.   On: 09/13/2022 12:41    Procedures Procedures    Medications Ordered in ED Medications  oxyCODONE-acetaminophen (PERCOCET/ROXICET) 5-325 MG per tablet 1 tablet (has no administration in time range)  ondansetron (ZOFRAN-ODT) disintegrating tablet 4 mg (has no administration in time range)    ED Course/ Medical Decision Making/ A&P                              Medical Decision Making Risk Prescription drug management.   This patient presents to the ED for abdominal pain, this involves an extensive number of treatment options, and is a complaint that carries with a high risk of complications and morbidity.  The differential diagnosis includes cholecystitis, pancreatitis, cystitis, appendicitis, diverticulitis, pyelonephritis, nephrolithiasis, SBO, infectious etiology.  This is not an exhaustive list.  Lab tests: Labs were ordered earlier today. WBC unremarkable. Hbg 8.7. Platelets unremarkable. Electrolytes unremarkable. BUN, creatinine unremarkable.  Urinalysis unremarkable.  Pregnancy test negative.  Imaging studies: I ordered imaging studies. I personally reviewed, interpreted imaging and agree with the radiologist's interpretations. The results include: CT abdomen pelvis show no acute abdominal or pelvic abnormalities.  Problem list/ ED course/ Critical interventions/  Medical management: HPI: See above Vital signs within normal range and stable throughout visit. Laboratory/imaging studies significant for: See above. On physical examination, patient is afebrile and appears in no acute distress.  Patient presents with abdominal pain which was evaluated earlier this morning.  CBC with no leukocytosis or anemia.  CMP with no evidence of acute electrolyte abnormalities or AKI.  Urine pregnancy test negative.  CT abdomen pelvis showed no acute abdominal pelvic abnormalities.  She returned with worsening pain after having dinner.  Given workup earlier today, low suspicion for emergent causes of the abdominal pain including appendicitis, kidney stones, SBO, pyelonephritis, cystitis, diverticulitis.  Given Percocet and Zofran here.  I also sent an Rx of Pepcid and Protonix. Advised patient to take Tylenol/ibuprofen/naproxen for pain, follow-up with primary care physician for further evaluation and management, return to the ER if new or worsening  symptoms.  I have reviewed the patient home medicines and have made adjustments as needed.  Cardiac monitoring/EKG: The patient was maintained on a cardiac monitor.  I personally reviewed and interpreted the cardiac monitor which showed an underlying rhythm of: sinus rhythm.  Additional history obtained: External records from outside source obtained and reviewed including: Chart review including previous notes, labs, imaging.  Consultations obtained:  Disposition Continued outpatient therapy. Follow-up with PCP recommended for reevaluation of symptoms. Treatment plan discussed with patient.  Pt acknowledged understanding was agreeable to the plan. Worrisome signs and symptoms were discussed with patient, and patient acknowledged understanding to return to the ED if they noticed these signs and symptoms. Patient was stable upon discharge.   This chart was dictated using voice recognition software.  Despite best efforts to proofread,  errors can occur which can change the documentation meaning.          Final Clinical Impression(s) / ED Diagnoses Final diagnoses:  Right sided abdominal pain    Rx / DC Orders ED Discharge Orders          Ordered    famotidine (PEPCID) 20 MG tablet  2 times daily        09/13/22 2140    pantoprazole (PROTONIX) 20 MG tablet  Daily        09/13/22 2140    ondansetron (ZOFRAN) 4 MG tablet  Every 6 hours        09/13/22 2140              Jeanelle Malling, PA 09/13/22 2151    Glyn Ade, MD 09/16/22 [phone]

## 2022-09-13 NOTE — Discharge Instructions (Addendum)
Please take your medications as prescribed. Take tylenol/ibuprofen for pain, Zofran for nausea. I recommend close follow-up with PCP for reevaluation.  Please do not hesitate to return to emergency department if worrisome signs symptoms we discussed become apparent.  

## 2022-09-14 ENCOUNTER — Telehealth: Payer: Medicaid Other | Admitting: Family Medicine

## 2022-09-14 NOTE — Progress Notes (Signed)
Pt did not show or answer for visit. DWB

## 2022-09-15 ENCOUNTER — Telehealth: Payer: Medicaid Other | Admitting: Family

## 2022-09-15 DIAGNOSIS — R509 Fever, unspecified: Secondary | ICD-10-CM

## 2022-09-15 DIAGNOSIS — Z09 Encounter for follow-up examination after completed treatment for conditions other than malignant neoplasm: Secondary | ICD-10-CM

## 2022-09-15 DIAGNOSIS — R1031 Right lower quadrant pain: Secondary | ICD-10-CM

## 2022-09-15 NOTE — Progress Notes (Signed)
Virtual Visit Consent   Danielle Hobbs, you are scheduled for a virtual visit with a [facility] provider today. Just as with appointments in the office, your consent must be obtained to participate. Your consent will be active for this visit and any virtual visit you may have with one of our providers in the next 365 days. If you have a MyChart account, a copy of this consent can be sent to you electronically.  As this is a virtual visit, video technology does not allow for your provider to perform a traditional examination. This may limit your provider's ability to fully assess your condition. If your provider identifies any concerns that need to be evaluated in person or the need to arrange testing (such as labs, EKG, etc.), we will make arrangements to do so. Although advances in technology are sophisticated, we cannot ensure that it will always work on either your end or our end. If the connection with a video visit is poor, the visit may have to be switched to a telephone visit. With either a video or telephone visit, we are not always able to ensure that we have a secure connection.  By engaging in this virtual visit, you consent to the provision of healthcare and authorize for your insurance to be billed (if applicable) for the services provided during this visit. Depending on your insurance coverage, you may receive a charge related to this service.  I need to obtain your verbal consent now. Are you willing to proceed with your visit today? Danielle Hobbs has provided verbal consent on 09/15/2022 for a virtual visit (video or telephone). Danielle Rodney, FNP  Date: 09/15/2022 8:39 AM  Virtual Visit via Video Note   I, Danielle Hobbs, connected with  Danielle Hobbs  ([id number], 24-Aug-2000) on 09/15/22 at  8:45 AM EDT by a video-enabled telemedicine application and verified that I am speaking with the correct person using two identifiers.  Location: Patient: Virtual Visit Location Patient:  Home Provider: Virtual Visit Location Provider: Home Office   I discussed the limitations of evaluation and management by telemedicine and the availability of in person appointments. The patient expressed understanding and agreed to proceed.    History of Present Illness: Danielle Hobbs is a 22 y.o. who identifies as a female who was assigned female at birth, and is being seen today for right lower abdominal pain that started last week on 09/10/22. She went to the ED on 09/13/22. She had a negative CT abdominal. She reports she continues to having vomiting. She was given zofran and pepcid without relief.    HPI: Abdominal Pain This is a new problem. The current episode started in the past 7 days. The problem occurs intermittently. The pain is located in the RLQ. The pain is at a severity of 7/10. The pain is mild. The quality of the pain is described as aching and sharp. Associated symptoms include belching, diarrhea, a fever, nausea and vomiting. Pertinent negatives include no constipation, dysuria or flatus. Past treatments include H2 blockers. The treatment provided mild relief.    Problems:  Patient Active Problem List   Diagnosis Date Noted   Healthcare maintenance 09/08/2022   Hypertension 08/20/2022   Anemia 08/20/2022   Back pain 08/20/2022   Vitamin D deficiency 07/07/2017   MDD (major depressive disorder), recurrent, in partial remission (HCC) 01/08/2017   Scoliosis 01/05/2016   PCOS (polycystic ovarian syndrome) 01/05/2016   Prediabetes 10/23/2015   Morbid obesity (HCC) 08/10/2015   Central auditory processing disorder (CAPD)  06/20/2015   Acne 02/23/2014   Allergic rhinitis 02/23/2014   Attention deficit hyperactivity disorder (ADHD), predominantly inattentive type 01/15/2013    Allergies: No Known Allergies Medications:  Current Outpatient Medications:    amLODipine (NORVASC) 5 MG tablet, Take 1 tablet (5 mg total) by mouth at bedtime., Disp: 30 tablet, Rfl: 0    amphetamine-dextroamphetamine (ADDERALL XR) 20 MG 24 hr capsule, Take 2 capsules in the morning, Disp: 60 capsule, Rfl: 0   ARIPiprazole (ABILIFY) 10 MG tablet, Take 1 tablet (10 mg total) by mouth daily., Disp: 30 tablet, Rfl: 3   busPIRone (BUSPAR) 15 MG tablet, Take 1 tablet (15 mg total) by mouth 3 (three) times daily., Disp: 90 tablet, Rfl: 3   cetirizine (ZYRTEC) 10 MG tablet, Take 1 tablet (10 mg total) by mouth daily., Disp: 30 tablet, Rfl: 3   famotidine (PEPCID) 20 MG tablet, Take 1 tablet (20 mg total) by mouth 2 (two) times daily., Disp: 30 tablet, Rfl: 0   ferrous sulfate 325 (65 FE) MG EC tablet, Take 1 tablet (325 mg total) by mouth every other day., Disp: 90 tablet, Rfl: 0   ondansetron (ZOFRAN) 4 MG tablet, Take 1 tablet (4 mg total) by mouth every 6 (six) hours., Disp: 12 tablet, Rfl: 0   pantoprazole (PROTONIX) 20 MG tablet, Take 1 tablet (20 mg total) by mouth daily., Disp: 15 tablet, Rfl: 0   sertraline (ZOLOFT) 100 MG tablet, Take two tablets daily, Disp: 60 tablet, Rfl: 3   traZODone (DESYREL) 150 MG tablet, Take 1 tablet (150 mg total) by mouth at bedtime as needed for sleep., Disp: 30 tablet, Rfl: 3  Observations/Objective: Patient is well-developed, well-nourished in no acute distress.  Resting comfortably at home.  Head is normocephalic, atraumatic.  No labored breathing. Speech is clear and coherent with logical content.  Patient is alert and oriented at baseline.    Assessment and Plan: 1. Right lower quadrant abdominal pain  2. Fever, unspecified fever cause  3. Hospital discharge follow-up  Given continued abdominal pain needs to be seen in person  Reviewed hospital notes and labs Force fluids Continue zofran and pepcid  Follow Up Instructions: I discussed the assessment and treatment plan with the patient. The patient was provided an opportunity to ask questions and all were answered. The patient agreed with the plan and demonstrated an understanding  of the instructions.  A copy of instructions were sent to the patient via MyChart unless otherwise noted below.     The patient was advised to call back or seek an in-person evaluation if the symptoms worsen or if the condition fails to improve as anticipated.  Time:  I spent 12 minutes with the patient via telehealth technology discussing the above problems/concerns.    Danielle Rodney, FNP

## 2022-09-15 NOTE — Patient Instructions (Signed)

## 2022-09-16 ENCOUNTER — Ambulatory Visit: Payer: Medicaid Other

## 2022-09-16 NOTE — Progress Notes (Deleted)
  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 -Returned to ED 5/10 for worsening pain after meal > percocet and zofran and started on famotidine and pantoprazole   PERTINENT  PMH / PSH: ***  Past Medical History:  Diagnosis Date   ADHD (attention deficit hyperactivity disorder)    Anxiety    Borderline personality disorder (HCC)    Depression    Phreesia 08/06/2020   Eczema 02/23/2014   Headache    Heart murmur    Phreesia 08/06/2020   Hypertension    Irregular heart rhythm 02/23/2014   Benign PVC cardiology evaluated and no change to ADHD meds needed     PCOS (polycystic ovarian syndrome)     Patient Care Team: Elberta Fortis, MD as PCP - General (Family Medicine) OBJECTIVE:  LMP 08/17/2022  Physical Exam   ASSESSMENT/PLAN:  There are no diagnoses linked to this encounter. No follow-ups on file. Bess Kinds, MD 09/16/2022, 7:16 AM PGY-***, [facility] {    This will disappear when note is signed, click to select method of visit    :1}

## 2022-09-17 ENCOUNTER — Ambulatory Visit: Payer: Medicaid Other

## 2022-09-18 ENCOUNTER — Encounter: Payer: Self-pay | Admitting: Family Medicine

## 2022-09-18 ENCOUNTER — Ambulatory Visit (INDEPENDENT_AMBULATORY_CARE_PROVIDER_SITE_OTHER): Payer: Medicaid Other | Admitting: Family Medicine

## 2022-09-18 ENCOUNTER — Other Ambulatory Visit: Payer: Self-pay

## 2022-09-18 VITALS — BP 130/80 | HR 93 | Ht 66.0 in | Wt 309.4 lb

## 2022-09-18 DIAGNOSIS — Z309 Encounter for contraceptive management, unspecified: Secondary | ICD-10-CM

## 2022-09-18 DIAGNOSIS — R1031 Right lower quadrant pain: Secondary | ICD-10-CM | POA: Diagnosis present

## 2022-09-18 MED ORDER — PROMETHAZINE HCL 25 MG PO TABS: 25.0000 mg | ORAL_TABLET | Freq: Three times a day (TID) | ORAL | 0 refills | Status: AC | PRN

## 2022-09-18 MED ORDER — LEVOFLOXACIN 500 MG PO TABS: 500.0000 mg | ORAL_TABLET | Freq: Every day | ORAL | 0 refills | Status: AC

## 2022-09-18 NOTE — Patient Instructions (Addendum)
As we discussed, I am going to empirically treat you for a bowel infection like diverticulitis.  I am also giving you some Phenergan suppositories.  Please try 1 of those before you take the antibiotic.  Gradually increase your fluid intake.  Once you are getting better with the nausea you can switch to the ondansetron tablets you have further than the suppositories.  Regarding contraception, we have made an appointment for tomorrow morning at 10:00 in colposcopy/women's health clinic and I will see you then.  We are also going to make an appointment next week to follow-up regarding your abdominal pain.  Current

## 2022-09-18 NOTE — Progress Notes (Signed)
    CHIEF COMPLAINT / HPI: 1 week of abdominal pain.  Started off epigastric and periumbilical and then moved to right lower quadrant.  Has been seen at the emergency department twice.  Pain continues, also nausea and some vomiting.  No diarrhea.  Currently on her menses.  Sexually active with men but not currently taking any contraceptives.   PERTINENT  PMH / PSH: I have reviewed the patient's medications, allergies, past medical and surgical history, smoking status and updated in the EMR as appropriate. Reviewed CT scan labs from emergency department visit.  OBJECTIVE:  BP (!) 131/101   Pulse 93   Ht 5\' 6"  (1.676 m)   Wt (!) 309 lb 6.4 oz (140.3 kg)   LMP 09/18/2022   SpO2 100%   BMI 49.94 kg/m  GENERAL: Well-developed female no acute distress ABDOMEN: Soft, positive bowel sounds nontender nondistended  ASSESSMENT / PLAN: Nausea vomiting and right lower quadrant pain: Concerning for appendicitis however review of her lab work is negative for elevation of white count and CT scan was normal.  Could be subacute appendicitis or could be diverticulitis.  Long discussion with her about options and we decided to try trial dose of antibiotic therapy with Phenergan suppository for nausea.  I would like her to be seen by her PCP or me next week for follow-up and make sure the symptoms have resolved. 2.  Regarding contraception: Long discussion.  She was interested in the patch but this would likely not be the best option for her as she is over the weight maximum and because she sweats quite a bit not sure the patch would stay on.  Ultimately we decided to try Nexplanon and I have scheduled her for an appointment in the morning for that.  No problem-specific Assessment & Plan notes found for this encounter.   Denny Levy MD

## 2022-09-19 ENCOUNTER — Ambulatory Visit: Payer: Medicaid Other

## 2022-10-03 ENCOUNTER — Ambulatory Visit: Payer: Medicaid Other | Admitting: Plastic Surgery

## 2022-10-03 ENCOUNTER — Encounter: Payer: Self-pay | Admitting: Plastic Surgery

## 2022-10-03 VITALS — Ht 65.0 in | Wt 303.0 lb

## 2022-10-03 DIAGNOSIS — N62 Hypertrophy of breast: Secondary | ICD-10-CM | POA: Diagnosis not present

## 2022-10-03 DIAGNOSIS — M546 Pain in thoracic spine: Secondary | ICD-10-CM | POA: Diagnosis not present

## 2022-10-03 DIAGNOSIS — R21 Rash and other nonspecific skin eruption: Secondary | ICD-10-CM

## 2022-10-03 DIAGNOSIS — Z6841 Body Mass Index (BMI) 40.0 and over, adult: Secondary | ICD-10-CM

## 2022-10-03 DIAGNOSIS — M542 Cervicalgia: Secondary | ICD-10-CM | POA: Diagnosis not present

## 2022-10-03 NOTE — Progress Notes (Signed)
Referring Provider Elberta Fortis, MD [address] [place],  [place] [address]   CC:  Chief Complaint  Patient presents with   Advice Only      Danielle Hobbs is an 22 y.o. female.  HPI: Danielle Hobbs is a 22 year old female who presents today with complaints of upper back and neck pain as well as rashes under her breasts.  The this is due to the large size of her breast.  She is requesting a breast reduction.  No Known Allergies  Outpatient Encounter Medications as of 10/03/2022  Medication Sig   amLODipine (NORVASC) 5 MG tablet Take 1 tablet (5 mg total) by mouth at bedtime.   amphetamine-dextroamphetamine (ADDERALL XR) 20 MG 24 hr capsule Take 2 capsules in the morning   ARIPiprazole (ABILIFY) 10 MG tablet Take 1 tablet (10 mg total) by mouth daily.   busPIRone (BUSPAR) 15 MG tablet Take 1 tablet (15 mg total) by mouth 3 (three) times daily.   famotidine (PEPCID) 20 MG tablet Take 1 tablet (20 mg total) by mouth 2 (two) times daily.   ferrous sulfate 325 (65 FE) MG EC tablet Take 1 tablet (325 mg total) by mouth every other day.   levofloxacin (LEVAQUIN) 500 MG tablet Take 1 tablet (500 mg total) by mouth daily.   sertraline (ZOLOFT) 100 MG tablet Take two tablets daily   traZODone (DESYREL) 150 MG tablet Take 1 tablet (150 mg total) by mouth at bedtime as needed for sleep.   cetirizine (ZYRTEC) 10 MG tablet Take 1 tablet (10 mg total) by mouth daily.   ondansetron (ZOFRAN) 4 MG tablet Take 1 tablet (4 mg total) by mouth every 6 (six) hours. (Patient not taking: Reported on 10/03/2022)   pantoprazole (PROTONIX) 20 MG tablet Take 1 tablet (20 mg total) by mouth daily. (Patient not taking: Reported on 10/03/2022)   promethazine (PHENERGAN) 25 MG tablet Take 1 tablet (25 mg total) by mouth every 8 (eight) hours as needed for nausea or vomiting. (Patient not taking: Reported on 10/03/2022)   No facility-administered encounter medications on file as of 10/03/2022.     Past Medical History:   Diagnosis Date   ADHD (attention deficit hyperactivity disorder)    Anxiety    Borderline personality disorder (HCC)    Depression    Phreesia 08/06/2020   Eczema 02/23/2014   Headache    Heart murmur    Phreesia 08/06/2020   Hypertension    Irregular heart rhythm 02/23/2014   Benign PVC cardiology evaluated and no change to ADHD meds needed     PCOS (polycystic ovarian syndrome)     Past Surgical History:  Procedure Laterality Date   ADENOIDECTOMY     TONSILLECTOMY      Family History  Problem Relation Age of Onset   Anxiety disorder Mother    Depression Mother    Diabetes Maternal Grandmother    Drug abuse Maternal Grandmother    Bipolar disorder Maternal Grandmother    Depression Maternal Grandmother    Hypertension Other    Colon cancer Maternal Great-grandmother     Social History   Social History Narrative   Not on file     Review of Systems General: Denies fevers, chills, weight loss CV: Denies chest pain, shortness of breath, palpitations Breast: Patient feels that her breasts are contributing to her upper back and neck pain  Physical Exam    10/03/2022    1:11 PM 09/18/2022    4:42 PM 09/18/2022  3:38 PM  Vitals with BMI  Height 5\' 5"   5\' 6"   Weight 303 lbs  309 lbs 6 oz  BMI 50.42  49.96  Systolic  130 131  Diastolic  80 101  Pulse   93    General:  No acute distress,  Alert and oriented, Non-Toxic, Normal speech and affect Breast: Patient not examined on this visit Mammogram: Not applicable due to age Assessment/Plan Macromastia, morbid obesity: Patient is currently at a BMI of 50.  She is not a candidate for breast reduction at this time.  She states that she is currently working on diet and exercise trying to decrease her weight.  I have encouraged her to work towards a BMI of 40.  At this weight I believe that we can start considering her for a breast reduction.  Will put in a referral today to healthy weight and wellness.  She may  follow-up in the clinic as she desires.  Danielle Hobbs 10/03/2022, 1:18 PM

## 2022-10-07 ENCOUNTER — Ambulatory Visit: Payer: Medicaid Other | Admitting: Family Medicine

## 2022-10-07 NOTE — Progress Notes (Deleted)
    SUBJECTIVE:   CHIEF COMPLAINT / HPI:   Breast reduction around BMI 40 Nexplanon? PAP with STI screening?  Hypertension: - Medications: Amlodipine 5mg  daily - Compliance: *** - Checking BP at home: *** - Denies any SOB, CP, vision changes, LE edema, medication SEs, or symptoms of hypotension - Diet: *** - Exercise: ***   PERTINENT  PMH / PSH: ***  OBJECTIVE:   LMP 09/18/2022  ***  General: NAD, pleasant, able to participate in exam Cardiac: RRR, no murmurs. Respiratory: CTAB, normal effort, No wheezes, rales or rhonchi Abdomen: Bowel sounds present, nontender, nondistended Extremities: no edema or cyanosis. Skin: warm and dry, no rashes noted Neuro: alert, no obvious focal deficits Psych: Normal affect and mood  ASSESSMENT/PLAN:   No problem-specific Assessment & Plan notes found for this encounter.     Dr. Elberta Fortis, DO [facility]    {    This will disappear when note is signed, click to select method of visit    :1}

## 2022-10-09 ENCOUNTER — Encounter (INDEPENDENT_AMBULATORY_CARE_PROVIDER_SITE_OTHER): Payer: Medicaid Other | Admitting: Family Medicine

## 2022-12-04 ENCOUNTER — Telehealth: Payer: Self-pay | Admitting: Psychiatry

## 2022-12-04 NOTE — Telephone Encounter (Signed)
Referral for ophthalmology fax to [facility]. Phone: [phone], Fax: [phone]. 

## 2022-12-05 NOTE — Progress Notes (Deleted)
    SUBJECTIVE:   CHIEF COMPLAINT / HPI:   Eye twitching  Hypertension: - Medications: Amlodipine 5mg  - Compliance: *** - Checking BP at home: *** - Denies any SOB, CP, vision changes, LE edema, medication SEs, or symptoms of hypotension - Diet: *** - Exercise: ***   *Pap?  PERTINENT  PMH / PSH: ***  OBJECTIVE:   There were no vitals taken for this visit. ***  General: NAD, pleasant, able to participate in exam Cardiac: RRR, no murmurs. Respiratory: CTAB, normal effort, No wheezes, rales or rhonchi Abdomen: Bowel sounds present, nontender, nondistended Extremities: no edema or cyanosis. Skin: warm and dry, no rashes noted Neuro: alert, no obvious focal deficits Psych: Normal affect and mood  ASSESSMENT/PLAN:   No problem-specific Assessment & Plan notes found for this encounter.     Dr. Elberta Fortis, DO [facility]    {    This will disappear when note is signed, click to select method of visit    :1}

## 2022-12-06 ENCOUNTER — Ambulatory Visit: Payer: Medicaid Other | Admitting: Family Medicine

## 2023-01-02 NOTE — Progress Notes (Deleted)
    SUBJECTIVE:   CHIEF COMPLAINT / HPI:   Hypertension: - Medications: Amlodipine 5mg  - Compliance: *** - Checking BP at home: *** - Denies any SOB, CP, vision changes, LE edema, medication SEs, or symptoms of hypotension - Diet: *** - Exercise: ***   Anemia Taking iron every other day.  *Due for Pap  PERTINENT  PMH / PSH: ***  OBJECTIVE:   There were no vitals taken for this visit. ***  General: NAD, pleasant, able to participate in exam Cardiac: RRR, no murmurs. Respiratory: CTAB, normal effort, No wheezes, rales or rhonchi Abdomen: Bowel sounds present, nontender, nondistended Extremities: no edema or cyanosis. Skin: warm and dry, no rashes noted Neuro: alert, no obvious focal deficits Psych: Normal affect and mood  ASSESSMENT/PLAN:   No problem-specific Assessment & Plan notes found for this encounter.     Dr. Elberta Fortis, DO [facility]    {    This will disappear when note is signed, click to select method of visit    :1}

## 2023-01-03 ENCOUNTER — Ambulatory Visit: Payer: MEDICAID | Admitting: Family Medicine

## 2023-02-05 ENCOUNTER — Ambulatory Visit: Payer: Medicaid Other | Admitting: Plastic Surgery

## 2023-03-19 ENCOUNTER — Ambulatory Visit: Payer: MEDICAID | Admitting: Family Medicine

## 2023-03-20 ENCOUNTER — Ambulatory Visit: Payer: MEDICAID | Admitting: Family Medicine

## 2023-03-27 NOTE — Progress Notes (Deleted)
    SUBJECTIVE:   CHIEF COMPLAINT / HPI:   *Pap with G/C *BC given anemia, recheck CBC  Hypertension: - Medications: Amlodipine 5mg  - Compliance: *** - Checking BP at home: *** - Denies any SOB, CP, vision changes, LE edema, medication SEs, or symptoms of hypotension - Diet: *** - Exercise: ***   PERTINENT  PMH / PSH: ***  OBJECTIVE:   There were no vitals taken for this visit. ***  General: NAD, pleasant, able to participate in exam Cardiac: RRR, no murmurs. Respiratory: CTAB, normal effort, No wheezes, rales or rhonchi Abdomen: Bowel sounds present, nontender, nondistended Extremities: no edema or cyanosis. Skin: warm and dry, no rashes noted Neuro: alert, no obvious focal deficits Psych: Normal affect and mood  ASSESSMENT/PLAN:   No problem-specific Assessment & Plan notes found for this encounter.     Dr. Elberta Fortis, DO [facility]    {    This will disappear when note is signed, click to select method of visit    :1}

## 2023-03-28 ENCOUNTER — Ambulatory Visit: Payer: MEDICAID | Admitting: Family Medicine

## 2023-03-28 DIAGNOSIS — D500 Iron deficiency anemia secondary to blood loss (chronic): Secondary | ICD-10-CM

## 2023-04-08 ENCOUNTER — Ambulatory Visit (INDEPENDENT_AMBULATORY_CARE_PROVIDER_SITE_OTHER): Payer: MEDICAID | Admitting: Family Medicine

## 2023-04-08 VITALS — BP 133/94 | HR 92 | Ht 65.0 in | Wt 319.2 lb

## 2023-04-08 DIAGNOSIS — F3341 Major depressive disorder, recurrent, in partial remission: Secondary | ICD-10-CM

## 2023-04-08 DIAGNOSIS — F3342 Major depressive disorder, recurrent, in full remission: Secondary | ICD-10-CM

## 2023-04-08 DIAGNOSIS — D509 Iron deficiency anemia, unspecified: Secondary | ICD-10-CM

## 2023-04-08 DIAGNOSIS — R45851 Suicidal ideations: Secondary | ICD-10-CM

## 2023-04-08 DIAGNOSIS — F332 Major depressive disorder, recurrent severe without psychotic features: Secondary | ICD-10-CM

## 2023-04-08 DIAGNOSIS — N912 Amenorrhea, unspecified: Secondary | ICD-10-CM

## 2023-04-08 DIAGNOSIS — I10 Essential (primary) hypertension: Secondary | ICD-10-CM

## 2023-04-08 DIAGNOSIS — Z1159 Encounter for screening for other viral diseases: Secondary | ICD-10-CM

## 2023-04-08 DIAGNOSIS — G43809 Other migraine, not intractable, without status migrainosus: Secondary | ICD-10-CM

## 2023-04-08 DIAGNOSIS — E282 Polycystic ovarian syndrome: Secondary | ICD-10-CM

## 2023-04-08 LAB — POCT URINE PREGNANCY: Preg Test, Ur: NEGATIVE

## 2023-04-08 LAB — POCT GLYCOSYLATED HEMOGLOBIN (HGB A1C): Hemoglobin A1C: 5.4 % (ref 4.0–5.6)

## 2023-04-08 MED ORDER — AMLODIPINE BESYLATE 5 MG PO TABS: 5.0000 mg | ORAL_TABLET | Freq: Every day | ORAL | 1 refills | Status: AC

## 2023-04-08 MED ORDER — BUSPIRONE HCL 15 MG PO TABS: 15.0000 mg | ORAL_TABLET | Freq: Three times a day (TID) | ORAL | 3 refills | Status: AC

## 2023-04-08 MED ORDER — TRAZODONE HCL 150 MG PO TABS: 150.0000 mg | ORAL_TABLET | Freq: Every evening | ORAL | 3 refills | Status: AC | PRN

## 2023-04-08 MED ORDER — SERTRALINE HCL 100 MG PO TABS: ORAL_TABLET | ORAL | 3 refills | Status: AC

## 2023-04-08 MED ORDER — SUMATRIPTAN SUCCINATE 50 MG PO TABS: 50.0000 mg | ORAL_TABLET | ORAL | 3 refills | Status: AC | PRN

## 2023-04-08 NOTE — Patient Instructions (Addendum)
It was wonderful to see you today! Thank you for choosing [facility].   Please bring ALL of your medications with you to every visit.   Today we talked about:  Please try to take your blood pressure at home 2-3 times per week.  Your goal blood pressure is under 130/80. I would like you to schedule with Dr. Raymondo Band (Pharmacist) at the front to have a 24 hour ambulatory blood pressure monitoring done. I am referring you to psychiatry today for management of your depression.  I would also recommend calling the number on the back of your insurance card to see which psychiatry providers are in network to see if you can get in sooner.  Please have a safety plan in mind including called the suicide helpline and going to the behavioral health center at [facility] if you have concerns that you may hurt yourself. We are checking lab work today to see how your anemia is doing.  I do think your anemia is secondary to your heavy periods. For your PCOS usually the treatment is hormonal therapy which unfortunately you are not a candidate for given your migraines.  We will check your A1c today and if noted you are prediabetic I would recommend starting metformin. For your migraines I think we will try abortive therapy first.  Please take the sumatriptan when you feel a migraine coming on and it should help with the symptoms.  Please keep in mind it can make you feel foggy.  I do not recommend taking this medication if you could possibly be pregnant therefore only take if you are certain you are not pregnant.  Please follow up in 2 weeks for mood check in   We are checking some labs today. If they are abnormal, I will call you. If they are normal, I will send you a MyChart message (if it is active) or a letter in the mail. If you do not hear about your labs in the next 2 weeks, please call the office.  Call the clinic at [phone] if your symptoms worsen or you have any concerns.  Please be sure to  schedule follow up at the front desk before you leave today.   Elberta Fortis, DO Family Medicine    Blood Pressure Record Sheet To take your blood pressure, you will need a blood pressure machine. You can buy a blood pressure machine (blood pressure monitor) at your clinic, drug store, or online. When choosing one, consider: An automatic monitor that has an arm cuff. A cuff that wraps snugly around your upper arm. You should be able to fit only one finger between your arm and the cuff. A device that stores blood pressure reading results. Do not choose a monitor that measures your blood pressure from your wrist or finger. Follow your health care provider's instructions for how to take your blood pressure. To use this form: Take your blood pressure medications every day These measurements should be taken when you have been at rest for at least 10-15 min Take at least 2 readings with each blood pressure check. This makes sure the results are correct. Wait 1-2 minutes between measurements. Write down the results in the spaces on this form. Keep in mind it should always be recorded systolic over diastolic. Both numbers are important.  Repeat this every day for 2-3 weeks, or as told by your health care provider.  Make a follow-up appointment with your health care provider to discuss the results.  Blood  Pressure Log Date Medications taken? (Y/N) Blood Pressure Time of Day

## 2023-04-08 NOTE — Progress Notes (Unsigned)
SUBJECTIVE:   CHIEF COMPLAINT / HPI:   MDD and SI Reports she has been going through a hard time recently since her mother's boyfriend moved in.  Depression is currently uncontrolled.  States she has "active and passive" SI at times.  No active plan protective factor in her cousin.  Willing to see a psychiatrist.  Requesting refill on psych meds.  Hypertension: - Medications: Amlodipine 5mg  - Compliance: Yes x 2 weeks - Checking BP at home: 168/94 upon last reading but states it has been in that range - Denies any SOB, CP, vision changes, LE edema, medication SEs, or symptoms of hypotension  Migraine Present for the past week. Associated with photophobia and nausea. History of migraines, not currently on medication.  Giving 1 migraine episode per month but states it can last up to a week at a time.  Endorses aura that she feels "kaleidoscope".  PCOS History of irregular cycle, last.  About a month ago but only lasted 3 days.  In August she remembers having 3 cycles.  Previously tried to get pregnant but has not anymore.  Does not desire pregnancy in the next year.  Concerned about insulin resistance.   PERTINENT  PMH / PSH:   OBJECTIVE:   BP (!) 133/94   Pulse 92   Ht 5\' 5"  (1.651 m)   Wt (!) 319 lb 3.2 oz (144.8 kg)   SpO2 100%   BMI 53.12 kg/m    General: NAD, pleasant, able to participate in exam Cardiac: RRR, no murmurs. Respiratory: CTAB, normal effort, No wheezes, rales or rhonchi Abdomen: Bowel sounds present, nontender, nondistended Extremities: no edema or cyanosis. Skin: warm and dry, no rashes noted Neuro: alert, no obvious focal deficits Psych: Normal affect and mood  ASSESSMENT/PLAN:   Assessment & Plan Severe episode of recurrent major depressive disorder, without psychotic features (HCC) PHQ: 27, positive for SI but denies active plan currently.  Worsening mood exacerbated by social situation, has protective factor in cousin.  Able to safety plan,  states she would call the suicide hotline and go to [facility].  Wants to reestablish with psychiatry but requesting refills of psych meds in the meantime. -Referral to psychiatry, advised patient to also call the number on the back of her insurance card to find physician in network -Refill Sertraline, Buspirone and Trazodone Iron deficiency anemia, unspecified iron deficiency anemia type Hgb 8.7 in 09/2022, likely in the setting of irregular menstrual cycle and heavy bleeding.  Not currently taking oral iron but asymptomatic. -CBC and ferritin PCOS (polycystic ovarian syndrome) Discussed options to regulate cycle, unfortunately patient has migraine with aura therefore not a candidate for combined hormonal therapy.  Bleeding frequently enough that I do not think she need progesterone for withdrawal bleeding for endometrial protection.  Declined IUD.  Will check A1c and consider starting metformin. -A1c Amenorrhea Last cycle in November irregular, bleeding for only 3 days but given above PCOS patient requesting pregnancy test.  You Prag negative Other migraine without status migrainosus, not intractable Symptoms most consistent with migraine in clinic today, no neurologic deficit.  Roughly 1 migrainous episode per month but can last up to a week, good candidate for abortive therapy. -Sumatriptan 50 mg as needed, advised only take if certain she is not pregnant Primary hypertension 133/94 upon repeat, multiple elevated readings at home with SBP in 160s.  Compliant on amlodipine 5 mg, discussed medication adjustment but patient opted for 24-hour blood pressure monitoring.  Would need pregnancy safe blood pressure  medication. -Scheduled with Dr. Raymondo Band for BP monitoring     Dr. Elberta Fortis, DO [facility]

## 2023-04-09 LAB — HCV AB W REFLEX TO QUANT PCR: HCV Ab: NONREACTIVE

## 2023-04-09 LAB — CBC
Hematocrit: 31.2 % — ABNORMAL LOW (ref 34.0–46.6)
Hemoglobin: 8.3 g/dL — ABNORMAL LOW (ref 11.1–15.9)
MCH: 17.9 pg — ABNORMAL LOW (ref 26.6–33.0)
MCHC: 26.6 g/dL — ABNORMAL LOW (ref 31.5–35.7)
MCV: 67 fL — ABNORMAL LOW (ref 79–97)
Platelets: 384 10*3/uL (ref 150–450)
RBC: 4.64 x10E6/uL (ref 3.77–5.28)
RDW: 18.5 % — ABNORMAL HIGH (ref 11.7–15.4)
WBC: 6.3 10*3/uL (ref 3.4–10.8)

## 2023-04-09 LAB — FERRITIN: Ferritin: 5 ng/mL — ABNORMAL LOW (ref 15–150)

## 2023-04-09 LAB — HCV INTERPRETATION

## 2023-04-09 LAB — TSH RFX ON ABNORMAL TO FREE T4: TSH: 1.26 u[IU]/mL (ref 0.450–4.500)

## 2023-04-09 NOTE — Assessment & Plan Note (Signed)
133/94 upon repeat, multiple elevated readings at home with SBP in 160s.  Compliant on amlodipine 5 mg, discussed medication adjustment but patient opted for 24-hour blood pressure monitoring.  Would need pregnancy safe blood pressure medication. -Scheduled with Dr. Raymondo Band for BP monitoring

## 2023-04-09 NOTE — Assessment & Plan Note (Signed)
Discussed options to regulate cycle, unfortunately patient has migraine with aura therefore not a candidate for combined hormonal therapy.  Bleeding frequently enough that I do not think she need progesterone for withdrawal bleeding for endometrial protection.  Declined IUD.  Will check A1c and consider starting metformin. -A1c

## 2023-04-09 NOTE — Assessment & Plan Note (Signed)
Hgb 8.7 in 09/2022, likely in the setting of irregular menstrual cycle and heavy bleeding.  Not currently taking oral iron but asymptomatic. -CBC and ferritin

## 2023-04-16 ENCOUNTER — Ambulatory Visit: Payer: Self-pay | Admitting: Pharmacist

## 2023-04-16 ENCOUNTER — Telehealth: Payer: Self-pay | Admitting: Family Medicine

## 2023-04-16 NOTE — Telephone Encounter (Signed)
Left HIPAA compliant voicemail for patient regarding persistent anemia.  Patient would be a good candidate for IV iron infusion given progressively dropping hemoglobin in the setting of difficulty taking oral iron.  Will send a letter and discuss further at follow-up visit next week.  Elberta Fortis, DO

## 2023-04-22 ENCOUNTER — Ambulatory Visit: Payer: Self-pay | Admitting: Family Medicine

## 2023-04-22 NOTE — Progress Notes (Deleted)
    SUBJECTIVE:   CHIEF COMPLAINT / HPI:   IV iron?  Pap  PERTINENT  PMH / PSH: ***  OBJECTIVE:   There were no vitals taken for this visit. ***  General: NAD, pleasant, able to participate in exam Cardiac: RRR, no murmurs. Respiratory: CTAB, normal effort, No wheezes, rales or rhonchi Abdomen: Bowel sounds present, nontender, nondistended Extremities: no edema or cyanosis. Skin: warm and dry, no rashes noted Neuro: alert, no obvious focal deficits Psych: Normal affect and mood  ASSESSMENT/PLAN:   No problem-specific Assessment & Plan notes found for this encounter.     Dr. Elberta Fortis, DO [facility]    {    This will disappear when note is signed, click to select method of visit    :1}

## 2023-04-23 ENCOUNTER — Other Ambulatory Visit: Payer: Self-pay

## 2023-04-23 ENCOUNTER — Emergency Department (HOSPITAL_BASED_OUTPATIENT_CLINIC_OR_DEPARTMENT_OTHER)
Admission: EM | Admit: 2023-04-23 | Discharge: 2023-04-23 | Disposition: A | Discharge status: Home / Self Care | Payer: Self-pay | Attending: Emergency Medicine | Admitting: Emergency Medicine

## 2023-04-23 ENCOUNTER — Encounter (HOSPITAL_BASED_OUTPATIENT_CLINIC_OR_DEPARTMENT_OTHER): Payer: Self-pay | Admitting: Emergency Medicine

## 2023-04-23 ENCOUNTER — Emergency Department (HOSPITAL_BASED_OUTPATIENT_CLINIC_OR_DEPARTMENT_OTHER): Payer: Self-pay

## 2023-04-23 ENCOUNTER — Emergency Department (HOSPITAL_BASED_OUTPATIENT_CLINIC_OR_DEPARTMENT_OTHER): Payer: Self-pay | Admitting: Radiology

## 2023-04-23 DIAGNOSIS — R112 Nausea with vomiting, unspecified: Secondary | ICD-10-CM | POA: Insufficient documentation

## 2023-04-23 DIAGNOSIS — Z20822 Contact with and (suspected) exposure to covid-19: Secondary | ICD-10-CM | POA: Insufficient documentation

## 2023-04-23 DIAGNOSIS — Z79899 Other long term (current) drug therapy: Secondary | ICD-10-CM | POA: Insufficient documentation

## 2023-04-23 DIAGNOSIS — R519 Headache, unspecified: Secondary | ICD-10-CM

## 2023-04-23 DIAGNOSIS — I10 Essential (primary) hypertension: Secondary | ICD-10-CM | POA: Insufficient documentation

## 2023-04-23 LAB — RESP PANEL BY RT-PCR (RSV, FLU A&B, COVID)  RVPGX2
Influenza A by PCR: NEGATIVE
Influenza B by PCR: NEGATIVE
Resp Syncytial Virus by PCR: NEGATIVE
SARS Coronavirus 2 by RT PCR: NEGATIVE

## 2023-04-23 LAB — PREGNANCY, URINE: Preg Test, Ur: NEGATIVE

## 2023-04-23 MED ORDER — HYDROCODONE-ACETAMINOPHEN 5-325 MG PO TABS
1.0000 | ORAL_TABLET | Freq: Once | ORAL | Status: AC
  Administered 2023-04-23: 1 via ORAL
  Filled 2023-04-23: qty 1

## 2023-04-23 NOTE — Discharge Instructions (Addendum)
Increase your Norvasc to 10 mg a day.  Make an appointment follow-up with your doctor to have your blood pressure rechecked.  Would recommend trying the Imitrex for the headaches.  Make an appointment to follow-up with [facility] neurology.  Information provided above.  Head CT did raise some concerns about some idiopathic increased pressure in the brain.  This could possibly explain your headache symptoms as well as your high blood pressure.

## 2023-04-23 NOTE — ED Provider Notes (Addendum)
[place] EMERGENCY DEPARTMENT AT [facility] Provider Note   CSN: [id number] Arrival date & time: 04/23/23  1401     History  Chief Complaint  Patient presents with   Migraine    Danielle Hobbs is a 22 y.o. female.  Patient here with concerns for high blood pressure also for a headache that she calls a migraine but never formally diagnosed with migraines.  However she has had similar headaches recently and her primary care doctor did start her on Imitrex.  Currently the headache which is kind in the forehead area has been present for 3 days.  Associated with some nausea and vomiting vomiting rare.  Generalized feeling of weak there is been a little bit of a cough blood pressures were 160/97 at home upon arrival here 177/114.  Patient was started on Imitrex suggest on December 3.  Patient did not pick it up so she has not tried that yet.  Past medical history sniffing for attention deficit disorder polycystic ovarian syndrome borderline personality disorder hypertension.  Patient is on medications for the high blood pressure.  She is on Norvasc just 5 mg once a day.  And that was just started on December 3 as well.       Home Medications Prior to Admission medications   Medication Sig Start Date End Date Taking? Authorizing Provider  amLODipine (NORVASC) 5 MG tablet Take 1 tablet (5 mg total) by mouth at bedtime. 04/08/23   Elberta Fortis, MD  amphetamine-dextroamphetamine (ADDERALL XR) 20 MG 24 hr capsule Take 2 capsules in the morning 08/14/21   Nelly Rout, MD  ARIPiprazole (ABILIFY) 10 MG tablet Take 1 tablet (10 mg total) by mouth daily. 06/19/21   Shanna Cisco, NP  busPIRone (BUSPAR) 15 MG tablet Take 1 tablet (15 mg total) by mouth 3 (three) times daily. 04/08/23   Elberta Fortis, MD  cetirizine (ZYRTEC) 10 MG tablet Take 1 tablet (10 mg total) by mouth daily. 07/04/17   Marca Ancona, MD  famotidine (PEPCID) 20 MG tablet Take 1 tablet (20 mg total) by  mouth 2 (two) times daily. 09/13/22   Jeanelle Malling, PA  ferrous sulfate 325 (65 FE) MG EC tablet Take 1 tablet (325 mg total) by mouth every other day. 09/06/22 03/05/23  Elberta Fortis, MD  levofloxacin (LEVAQUIN) 500 MG tablet Take 1 tablet (500 mg total) by mouth daily. 09/18/22   Nestor Ramp, MD  ondansetron (ZOFRAN) 4 MG tablet Take 1 tablet (4 mg total) by mouth every 6 (six) hours. Patient not taking: Reported on 10/03/2022 09/13/22   Jeanelle Malling, PA  pantoprazole (PROTONIX) 20 MG tablet Take 1 tablet (20 mg total) by mouth daily. Patient not taking: Reported on 10/03/2022 09/13/22   Jeanelle Malling, PA  promethazine (PHENERGAN) 25 MG tablet Take 1 tablet (25 mg total) by mouth every 8 (eight) hours as needed for nausea or vomiting. Patient not taking: Reported on 10/03/2022 09/18/22   Nestor Ramp, MD  sertraline (ZOLOFT) 100 MG tablet Take two tablets daily 04/08/23   Elberta Fortis, MD  SUMAtriptan (IMITREX) 50 MG tablet Take 1 tablet (50 mg total) by mouth every 2 (two) hours as needed for migraine. May repeat in 2 hours if headache persists or recurs. 04/08/23   Elberta Fortis, MD  traZODone (DESYREL) 150 MG tablet Take 1 tablet (150 mg total) by mouth at bedtime as needed for sleep. 04/08/23   Elberta Fortis, MD      Allergies    Patient  has no known allergies.    Review of Systems   Review of Systems  Constitutional:  Negative for chills and fever.  HENT:  Negative for ear pain and sore throat.   Eyes:  Negative for pain and visual disturbance.  Respiratory:  Negative for cough and shortness of breath.   Cardiovascular:  Negative for chest pain and palpitations.  Gastrointestinal:  Positive for nausea and vomiting. Negative for abdominal pain.  Genitourinary:  Negative for dysuria and hematuria.  Musculoskeletal:  Negative for arthralgias and back pain.  Skin:  Negative for color change and rash.  Neurological:  Positive for headaches. Negative for seizures and syncope.  All other systems  reviewed and are negative.   Physical Exam Updated Vital Signs BP (!) 172/116 (BP Location: Right Wrist)   Pulse 87   Temp 98.7 F (37.1 C) (Oral)   Resp 18   LMP 04/16/2023   SpO2 100%  Physical Exam Vitals and nursing note reviewed.  Constitutional:      General: She is not in acute distress.    Appearance: Normal appearance. She is well-developed.  HENT:     Head: Normocephalic and atraumatic.  Eyes:     Extraocular Movements: Extraocular movements intact.     Conjunctiva/sclera: Conjunctivae normal.     Pupils: Pupils are equal, round, and reactive to light.  Cardiovascular:     Rate and Rhythm: Normal rate and regular rhythm.     Heart sounds: No murmur heard. Pulmonary:     Effort: Pulmonary effort is normal. No respiratory distress.     Breath sounds: Normal breath sounds.  Abdominal:     Palpations: Abdomen is soft.     Tenderness: There is no abdominal tenderness.  Musculoskeletal:        General: No swelling.     Cervical back: Normal range of motion and neck supple. No rigidity.  Skin:    General: Skin is warm and dry.     Capillary Refill: Capillary refill takes less than 2 seconds.  Neurological:     General: No focal deficit present.     Mental Status: She is alert and oriented to person, place, and time.     Cranial Nerves: No cranial nerve deficit.     Sensory: No sensory deficit.     Motor: No weakness.  Psychiatric:        Mood and Affect: Mood normal.     ED Results / Procedures / Treatments   Labs (all labs ordered are listed, but only abnormal results are displayed) Labs Reviewed  RESP PANEL BY RT-PCR (RSV, FLU A&B, COVID)  RVPGX2  PREGNANCY, URINE  BASIC METABOLIC PANEL  CBC  TROPONIN I (HIGH SENSITIVITY)  TROPONIN I (HIGH SENSITIVITY)    EKG EKG Interpretation Date/Time:  Wednesday April 23 2023 14:16:29 EST Ventricular Rate:  91 PR Interval:  146 QRS Duration:  80 QT Interval:  354 QTC Calculation: 435 R  Axis:   74  Text Interpretation: Normal sinus rhythm Normal ECG When compared with ECG of 17-Jul-2021 03:28, No significant change since last tracing Confirmed by Linwood Dibbles [phone]) on 04/23/2023 2:20:47 PM  Radiology DG Chest 2 View Result Date: 04/23/2023 CLINICAL DATA:  Chest pain. EXAM: CHEST - 2 VIEW COMPARISON:  Chest radiograph dated July 17, 2021. FINDINGS: The heart size and mediastinal contours are within normal limits. Both lungs are clear. No pleural effusion or pneumothorax. The visualized skeletal structures are unremarkable. IMPRESSION: No active cardiopulmonary disease. Electronically Signed  By: Hart Robinsons M.D.   On: 04/23/2023 16:18    Procedures Procedures    Medications Ordered in ED Medications  HYDROcodone-acetaminophen (NORCO/VICODIN) 5-325 MG per tablet 1 tablet (has no administration in time range)    ED Course/ Medical Decision Making/ A&P                                 Medical Decision Making Amount and/or Complexity of Data Reviewed Labs: ordered. Radiology: ordered.  Risk Prescription drug management.  CT head pending.  Pregnancy test negative respiratory panel negative chest x-ray without any acute findings EKG without anything significant.  Patient had a little bit of chest discomfort for couple hours shortly prior to arrival felt as if like one of your swallowing something and something count and gets stuck at the bottom of your throat.  That is some resolved.  Patient without a true history of migraines.  But has been having trouble with headaches like this in her primary has prescribed Imitrex for her but she has not picked that up yet.  No family history of migraines.  Will get head CT here today just to rule out any significant problems.  Patient just started on the Norvasc 5 mg on December 3.  Could be increased to 10 mg.  Then close follow-up with primary care doctor.  Not concerned about hypertensive emergency.  Patient  nontoxic.  CT head no acute intracranial abnormalities.  But there is a partially empty sella which can be associated with idiopathic intracranial hypertension.  Based on this we will give patient referral back to primary care doctor as well as give her referral to [facility] neurology.  Patient headache is significantly improved.  Will have her follow back up with her primary care doctor have her increase her Norvasc to 10 mg a day.  I will also have her make an appointment to follow-up with [facility] neurology for further evaluation.  Head CT had nothing acute.  Patient has no focal neurodeficits patient nontoxic no acute distress  Final Clinical Impression(s) / ED Diagnoses Final diagnoses:  Nonintractable episodic headache, unspecified headache type  Primary hypertension    Rx / DC Orders ED Discharge Orders     None         Vanetta Mulders, MD 04/23/23 1913    Vanetta Mulders, MD 04/23/23 Margretta Ditty    Vanetta Mulders, MD 04/23/23 1925

## 2023-04-23 NOTE — ED Triage Notes (Signed)
Migraine x 3 day. Nausea vomiting Some chest pain Feeling weak Cough Reports high BP 160/97

## 2023-05-08 ENCOUNTER — Other Ambulatory Visit: Payer: Self-pay | Admitting: Pharmacist

## 2023-05-08 DIAGNOSIS — I159 Secondary hypertension, unspecified: Secondary | ICD-10-CM

## 2023-05-08 NOTE — Assessment & Plan Note (Signed)
 Patient contacted for follow-up of missed appointment for Amb BP monitoring. Offered to reschedule.   Rescheduled 05/14/2023 at 8:30 with  Return of BP monitor for 05/15/2023

## 2023-05-08 NOTE — Progress Notes (Signed)
 Patient contacted for follow-up of missed appointment for Amb BP monitoring. Offered to reschedule.   Rescheduled 05/14/2023 at 8:30 with  Return of BP monitor for 05/15/2023   Total time with patient call and documentation of interaction: 9 minutes.

## 2023-05-14 ENCOUNTER — Ambulatory Visit: Payer: Self-pay | Admitting: Pharmacist

## 2023-05-15 ENCOUNTER — Ambulatory Visit: Payer: Self-pay | Admitting: Pharmacist

## 2023-06-16 ENCOUNTER — Telehealth: Payer: Self-pay | Admitting: Pharmacist

## 2023-06-16 NOTE — Telephone Encounter (Signed)
 Attempted to contact patient for follow-up of missed Amb BP appt.    Left HIPAA compliant voice mail requesting call back to direct phone: [phone] to reschedule.   Total time with patient call and documentation of interaction: 6 minutes.

## 2023-08-16 IMAGING — CR DG CHEST 2V
2 series · 2 of 2 positions shown · non-contrast
Comparison: Chest radiograph dated 08/22/2020.

CLINICAL DATA: Cough.

EXAM:
CHEST - 2 VIEW

[w chest pa]
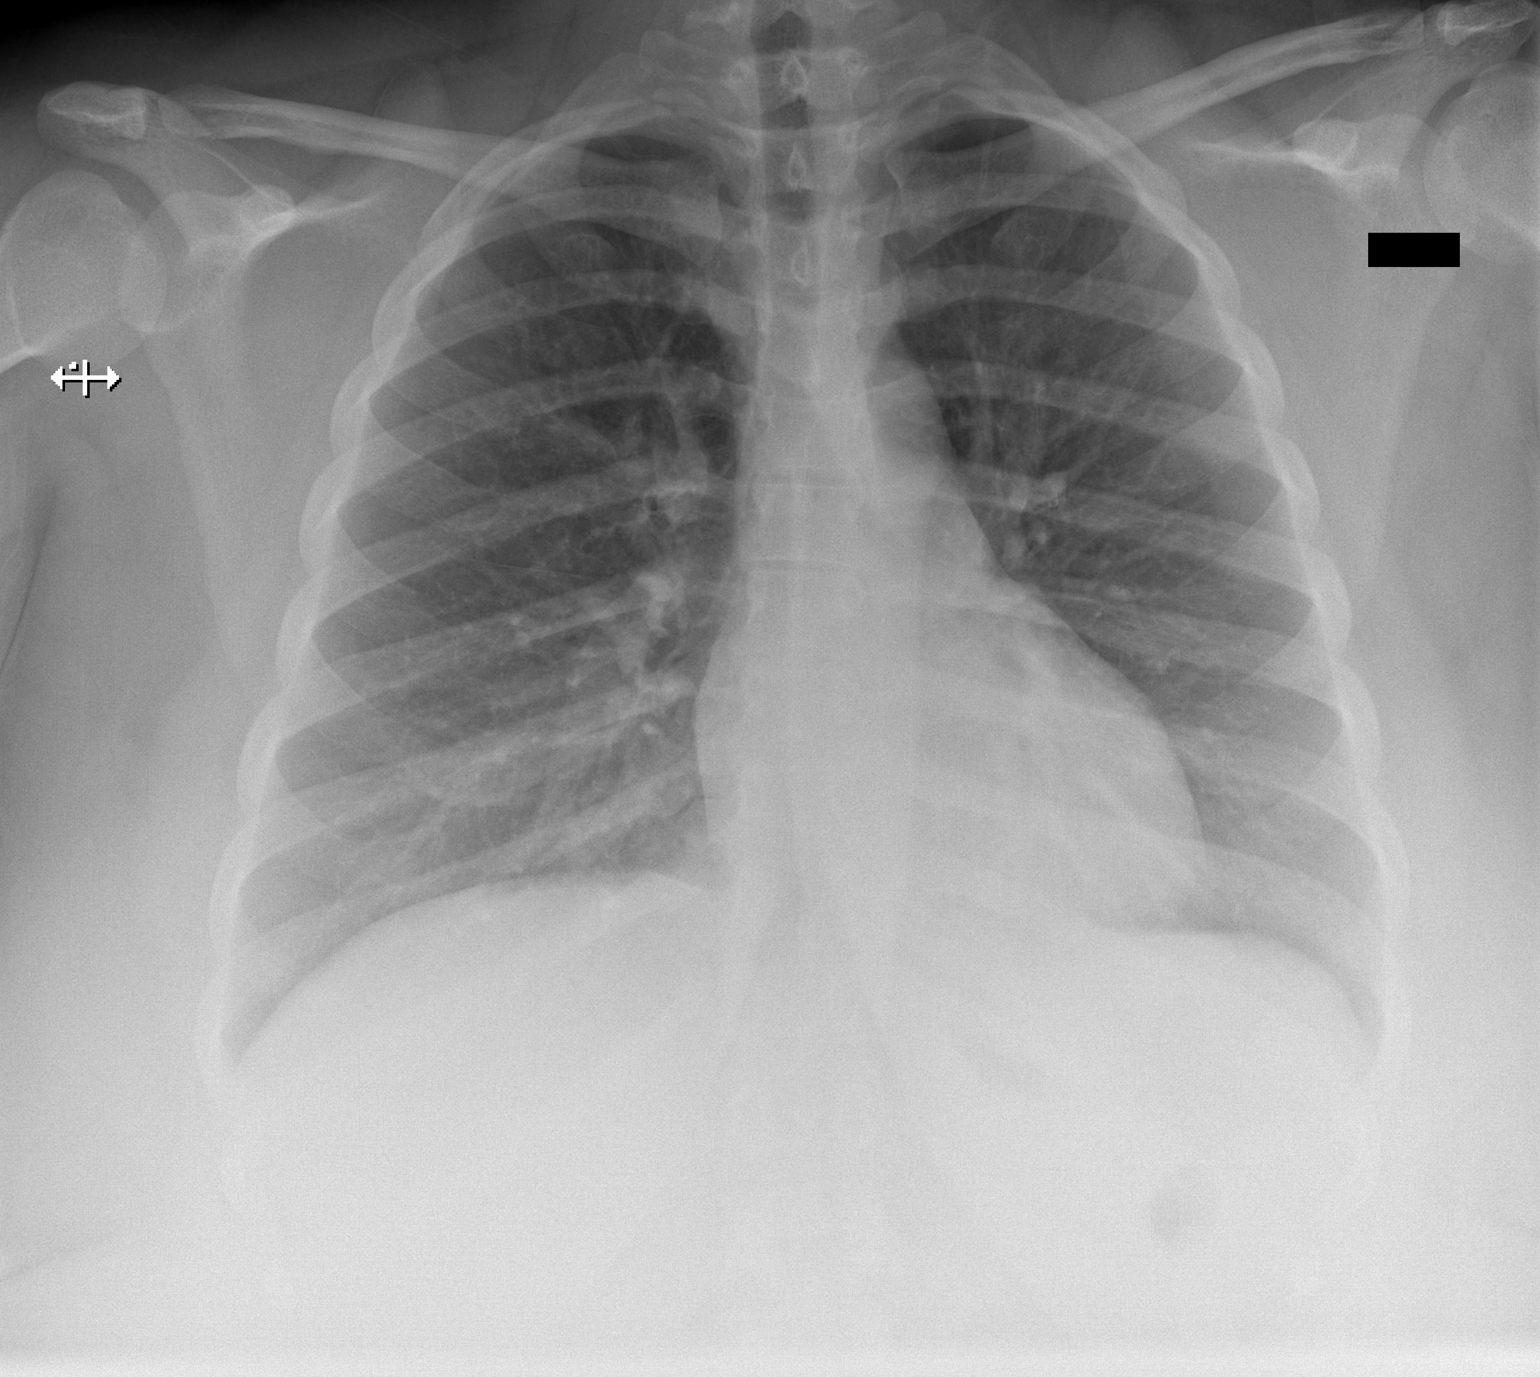

[w chest lat]
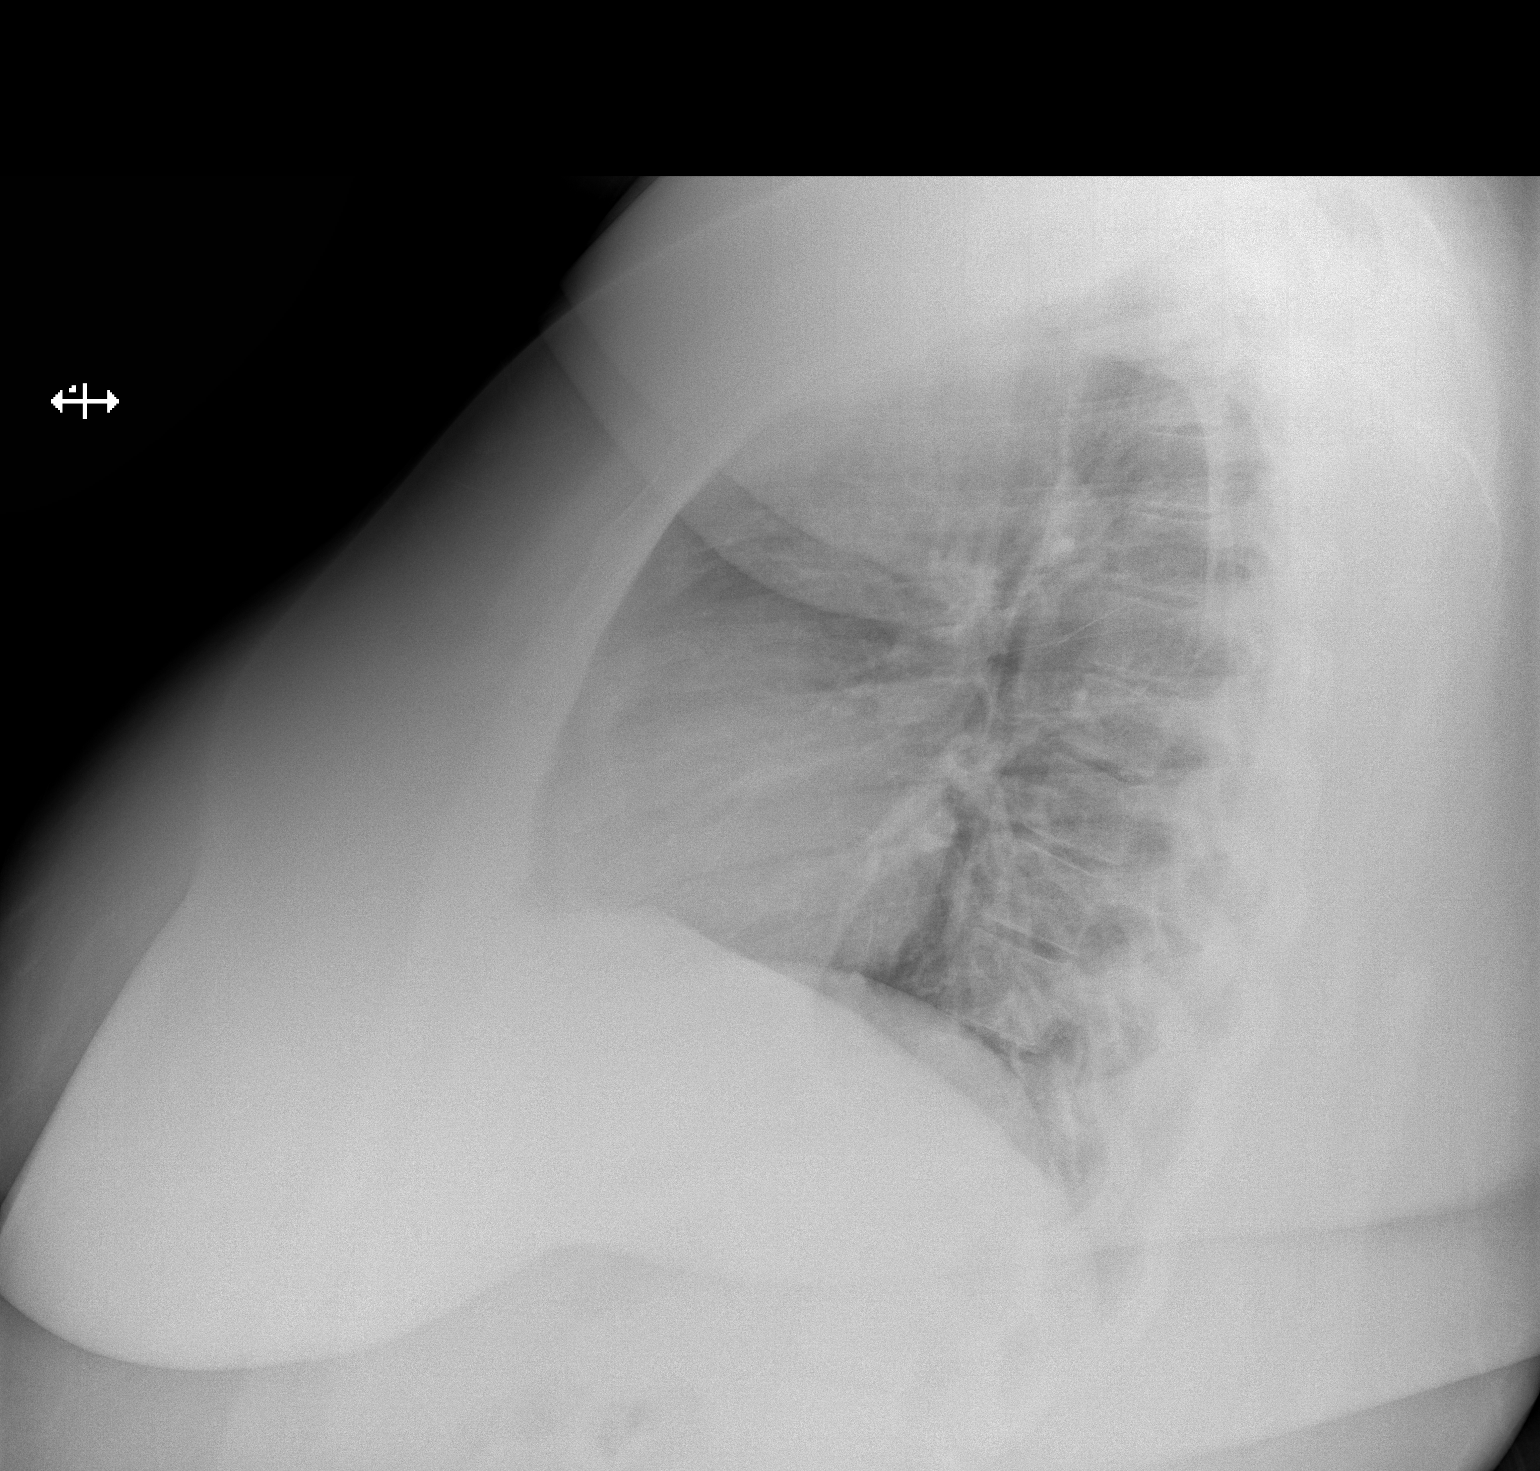

[2 of 2 positions shown; findings below may reference images not displayed]

FINDINGS: The heart size and mediastinal contours are within normal limits.
Both lungs are clear. The visualized skeletal structures are
unremarkable.
IMPRESSION: No active cardiopulmonary disease.

## 2023-11-18 ENCOUNTER — Emergency Department (HOSPITAL_BASED_OUTPATIENT_CLINIC_OR_DEPARTMENT_OTHER)
Admission: EM | Admit: 2023-11-18 | Discharge: 2023-11-18 | Disposition: A | Discharge status: Home / Self Care | Payer: MEDICAID | Attending: Emergency Medicine | Admitting: Emergency Medicine

## 2023-11-18 ENCOUNTER — Emergency Department (HOSPITAL_BASED_OUTPATIENT_CLINIC_OR_DEPARTMENT_OTHER): Payer: MEDICAID | Admitting: Radiology

## 2023-11-18 ENCOUNTER — Encounter (HOSPITAL_BASED_OUTPATIENT_CLINIC_OR_DEPARTMENT_OTHER): Payer: Self-pay

## 2023-11-18 ENCOUNTER — Other Ambulatory Visit: Payer: Self-pay

## 2023-11-18 DIAGNOSIS — M79671 Pain in right foot: Secondary | ICD-10-CM | POA: Diagnosis present

## 2023-11-18 DIAGNOSIS — W228XXA Striking against or struck by other objects, initial encounter: Secondary | ICD-10-CM | POA: Insufficient documentation

## 2023-11-18 DIAGNOSIS — L03119 Cellulitis of unspecified part of limb: Secondary | ICD-10-CM

## 2023-11-18 MED ORDER — CEPHALEXIN 250 MG PO CAPS
500.0000 mg | ORAL_CAPSULE | Freq: Once | ORAL | Status: AC
  Administered 2023-11-18: 500 mg via ORAL
  Filled 2023-11-18: qty 2

## 2023-11-18 MED ORDER — CEPHALEXIN 500 MG PO CAPS
500.0000 mg | ORAL_CAPSULE | Freq: Four times a day (QID) | ORAL | 0 refills | Status: AC
Start: 2023-11-18 — End: ?

## 2023-11-18 NOTE — ED Triage Notes (Signed)
 Pt POV d/t right heel puncture wound 4 days ago that is now causing pain all the way up to the right hip.

## 2023-11-18 NOTE — ED Provider Notes (Signed)
 [place] EMERGENCY DEPARTMENT AT [facility] Provider Note   CSN: [id number] Arrival date & time: 11/18/23  9493     Patient presents with: Foot Injury   Danielle Hobbs is a 23 y.o. female.   Patient is a 23 year old female presenting with right foot pain.  She reports stepping on the cap of a perfume bottle four days ago and that becoming stuck in her foot.  She removed it and the foot has been sore since.  Tonight pain worsens.  No fevers or chills.  Pain worse with bearing weight.  No alleviating factors.       Prior to Admission medications   Medication Sig Start Date End Date Taking? Authorizing Provider  amLODipine  (NORVASC ) 5 MG tablet Take 1 tablet (5 mg total) by mouth at bedtime. 04/08/23   Theophilus Pagan, MD  amphetamine -dextroamphetamine  (ADDERALL XR) 20 MG 24 hr capsule Take 2 capsules in the morning 08/14/21   Von Piggs, MD  ARIPiprazole  (ABILIFY ) 10 MG tablet Take 1 tablet (10 mg total) by mouth daily. 06/19/21   Harl Zane BRAVO, NP  busPIRone  (BUSPAR ) 15 MG tablet Take 1 tablet (15 mg total) by mouth 3 (three) times daily. 04/08/23   Theophilus Pagan, MD  cetirizine  (ZYRTEC ) 10 MG tablet Take 1 tablet (10 mg total) by mouth daily. 07/04/17   Vernona Aleck SAILOR, MD  famotidine  (PEPCID ) 20 MG tablet Take 1 tablet (20 mg total) by mouth 2 (two) times daily. 09/13/22   Ladora Congress, PA  ferrous sulfate  325 (65 FE) MG EC tablet Take 1 tablet (325 mg total) by mouth every other day. 09/06/22 03/05/23  Theophilus Pagan, MD  levofloxacin  (LEVAQUIN ) 500 MG tablet Take 1 tablet (500 mg total) by mouth daily. 09/18/22   Rosalynn Camie CROME, MD  ondansetron  (ZOFRAN ) 4 MG tablet Take 1 tablet (4 mg total) by mouth every 6 (six) hours. Patient not taking: Reported on 10/03/2022 09/13/22   Ladora Congress, PA  pantoprazole  (PROTONIX ) 20 MG tablet Take 1 tablet (20 mg total) by mouth daily. Patient not taking: Reported on 10/03/2022 09/13/22   Ladora Congress, PA  promethazine  (PHENERGAN ) 25 MG tablet  Take 1 tablet (25 mg total) by mouth every 8 (eight) hours as needed for nausea or vomiting. Patient not taking: Reported on 10/03/2022 09/18/22   Rosalynn Camie CROME, MD  sertraline  (ZOLOFT ) 100 MG tablet Take two tablets daily 04/08/23   Theophilus Pagan, MD  SUMAtriptan  (IMITREX ) 50 MG tablet Take 1 tablet (50 mg total) by mouth every 2 (two) hours as needed for migraine. May repeat in 2 hours if headache persists or recurs. 04/08/23   Theophilus Pagan, MD  traZODone  (DESYREL ) 150 MG tablet Take 1 tablet (150 mg total) by mouth at bedtime as needed for sleep. 04/08/23   Theophilus Pagan, MD    Allergies: Patient has no known allergies.    Review of Systems  All other systems reviewed and are negative.   Updated Vital Signs BP 137/83   Pulse 94   Temp 98.4 F (36.9 C) (Oral)   Resp 18   Ht 5' 6 (1.676 m)   Wt (!) 152.8 kg   LMP 11/11/2023   SpO2 98%   BMI 54.36 kg/m   Physical Exam Vitals and nursing note reviewed.  Constitutional:      Appearance: Normal appearance.  Pulmonary:     Effort: Pulmonary effort is normal.  Skin:    Comments: The bottom of the right foot has a small puncture  noted in the vicinity of the heel.  There is some surrounding erythema but no purulent drainage.  Neurological:     Mental Status: She is alert and oriented to person, place, and time.     (all labs ordered are listed, but only abnormal results are displayed) Labs Reviewed - No data to display  EKG: None  Radiology: No results found.   Procedures   Medications Ordered in the ED - No data to display                                  Medical Decision Making Amount and/or Complexity of Data Reviewed Radiology: ordered.   Xrays negative for retained foreign body.  Patient to be discharged with antibiotics for what appears to be cellulitis.     Final diagnoses:  None    ED Discharge Orders     None          Geroldine Berg, MD 11/18/23 [phone]

## 2023-11-18 NOTE — Discharge Instructions (Signed)
 Begin taking keflex  as prescribed.  Follow up with primary doctor if no improving in the next few days.
